# Patient Record
Sex: Male | Born: 1960 | Race: Black or African American | Hispanic: No | Marital: Married | Smoking: Never smoker
Health system: Southern US, Community
[De-identification: ages and names within clinical notes are randomized; demographics above are authoritative.]

## PROBLEM LIST (undated history)

## (undated) DIAGNOSIS — N200 Calculus of kidney: Secondary | ICD-10-CM

## (undated) DIAGNOSIS — I10 Essential (primary) hypertension: Secondary | ICD-10-CM

## (undated) HISTORY — PX: TONSILLECTOMY: SUR1361

---

## 2012-10-13 ENCOUNTER — Inpatient Hospital Stay (HOSPITAL_BASED_OUTPATIENT_CLINIC_OR_DEPARTMENT_OTHER)
Admission: EM | Admit: 2012-10-13 | Discharge: 2012-10-15 | DRG: 392 | Disposition: A | Discharge status: Home / Self Care | Payer: MEDICAID | Attending: Internal Medicine | Admitting: Internal Medicine

## 2012-10-13 ENCOUNTER — Emergency Department (HOSPITAL_BASED_OUTPATIENT_CLINIC_OR_DEPARTMENT_OTHER): Payer: Self-pay

## 2012-10-13 ENCOUNTER — Encounter (HOSPITAL_BASED_OUTPATIENT_CLINIC_OR_DEPARTMENT_OTHER): Payer: Self-pay | Admitting: *Deleted

## 2012-10-13 DIAGNOSIS — K644 Residual hemorrhoidal skin tags: Secondary | ICD-10-CM | POA: Diagnosis present

## 2012-10-13 DIAGNOSIS — Z87442 Personal history of urinary calculi: Secondary | ICD-10-CM

## 2012-10-13 DIAGNOSIS — K648 Other hemorrhoids: Secondary | ICD-10-CM | POA: Diagnosis present

## 2012-10-13 DIAGNOSIS — I10 Essential (primary) hypertension: Secondary | ICD-10-CM | POA: Diagnosis present

## 2012-10-13 DIAGNOSIS — A09 Infectious gastroenteritis and colitis, unspecified: Principal | ICD-10-CM | POA: Diagnosis present

## 2012-10-13 DIAGNOSIS — K529 Noninfective gastroenteritis and colitis, unspecified: Secondary | ICD-10-CM

## 2012-10-13 HISTORY — DX: Calculus of kidney: N20.0

## 2012-10-13 HISTORY — DX: Essential (primary) hypertension: I10

## 2012-10-13 LAB — CBC WITH DIFFERENTIAL/PLATELET
Basophils Absolute: 0.1 10*3/uL (ref 0.0–0.1)
Basophils Relative: 1 % (ref 0–1)
Eosinophils Absolute: 0.2 10*3/uL (ref 0.0–0.7)
HCT: 41.8 % (ref 39.0–52.0)
Hemoglobin: 14 g/dL (ref 13.0–17.0)
MCH: 32.6 pg (ref 26.0–34.0)
MCHC: 33.5 g/dL (ref 30.0–36.0)
Monocytes Absolute: 0.8 10*3/uL (ref 0.1–1.0)
Monocytes Relative: 8 % (ref 3–12)
RDW: 12.5 % (ref 11.5–15.5)

## 2012-10-13 LAB — COMPREHENSIVE METABOLIC PANEL
Albumin: 4.6 g/dL (ref 3.5–5.2)
BUN: 16 mg/dL (ref 6–23)
Calcium: 10.1 mg/dL (ref 8.4–10.5)
Creatinine, Ser: 1.3 mg/dL (ref 0.50–1.35)
Total Bilirubin: 0.4 mg/dL (ref 0.3–1.2)
Total Protein: 9 g/dL — ABNORMAL HIGH (ref 6.0–8.3)

## 2012-10-13 LAB — URINALYSIS, ROUTINE W REFLEX MICROSCOPIC
Bilirubin Urine: NEGATIVE
Ketones, ur: NEGATIVE mg/dL
Leukocytes, UA: NEGATIVE
Nitrite: NEGATIVE
Specific Gravity, Urine: 1.022 (ref 1.005–1.030)
Urobilinogen, UA: 0.2 mg/dL (ref 0.0–1.0)
pH: 5.5 (ref 5.0–8.0)

## 2012-10-13 LAB — URINE MICROSCOPIC-ADD ON

## 2012-10-13 LAB — LIPASE, BLOOD: Lipase: 32 U/L (ref 11–59)

## 2012-10-13 MED ORDER — SODIUM CHLORIDE 0.9 % IV SOLN
Freq: Once | INTRAVENOUS | Status: AC
  Administered 2012-10-13: via INTRAVENOUS

## 2012-10-13 MED ORDER — FENTANYL CITRATE 0.05 MG/ML IJ SOLN
100.0000 ug | Freq: Once | INTRAMUSCULAR | Status: DC
  Filled 2012-10-13: qty 2

## 2012-10-13 NOTE — ED Notes (Signed)
Left flank pain for over a week. Diarrhea off and on all week. He has had blood in his stools for the past 4 days.

## 2012-10-13 NOTE — ED Notes (Signed)
Pt reports left flank pain and diarrhea x 4 days. States that he has taken advil for the pain but nothing for the diarrhea. Denies nausea and vomiting with the flank pain. Denies burning on urination or blood in urine or stool. Pt rates flank pain 3/10 and refused pain medicine. Pt states diarrhea episodes have lessened in frequency, though pt had a diarrhea episode upon arrival to his room in the ER.

## 2012-10-13 NOTE — ED Provider Notes (Signed)
History   This chart was scribed for Hanley Seamen, MD by Albertha Ghee Rifaie. This patient was seen in room MH07/MH07 and the patient's care was started at 11:05 PM.   CSN: [id number]  Arrival date & time 10/13/12  2034   None     Chief Complaint  Patient presents with  . Diarrhea     The history is provided by the patient. No language interpreter was used.    Daniel Collier is a 51 y.o. male who presents to the Emergency Department complaining of a week of onset, intermittent diarrhea and 4 days of hematochezia that cleared up yesterday. There are no specific exacerbating or mitigating factors. The symptoms are moderate. He also c/o left flank pain a week ago, associated with generalized muscle cramping, that was not the same as kidney stone pain that he has had before. Pt denies nausea, vomiting, fever, chills. Pt denies smoking and alcohol use.    Past Medical History  Diagnosis Date  . Kidney stone     Past Surgical History  Procedure Date  . Tonsillectomy     No family history on file.  History  Substance Use Topics  . Smoking status: Never Smoker   . Smokeless tobacco: Not on file  . Alcohol Use: No      Review of Systems  All other systems reviewed and are negative.    Allergies  Review of patient's allergies indicates no known allergies.  Home Medications  No current outpatient prescriptions on file.  BP 177/104  Pulse 89  Temp 98.2 F (36.8 C) (Oral)  Resp 20  SpO2 97%  Physical Exam  Constitutional: He is oriented to person, place, and time. He appears well-developed and well-nourished.  HENT:  Head: Normocephalic and atraumatic.  Eyes: EOM are normal. Pupils are equal, round, and reactive to light.  Neck: Normal range of motion. Neck supple.  Cardiovascular: Normal rate, regular rhythm and normal heart sounds.   Pulmonary/Chest: Effort normal and breath sounds normal.  Abdominal: Soft. Bowel sounds are normal. He exhibits no distension.    Musculoskeletal: Normal range of motion. He exhibits no edema and no tenderness.  Neurological: He is alert and oriented to person, place, and time.    ED Course  Procedures (including critical care time)   DIAGNOSTIC STUDIES: Oxygen Saturation is 97% on room air , adequate by my interpretation.    COORDINATION OF CARE: 11:13 PM iscussed treatment plan with pt at bedside and pt agreed to plan.      MDM   Nursing notes and vitals signs, including pulse oximetry, reviewed.  Summary of this visit's results, reviewed by myself:  Labs:  Results for orders placed during the hospital encounter of 10/13/12  URINALYSIS, ROUTINE W REFLEX MICROSCOPIC      Component Value Range   Color, Urine YELLOW  YELLOW   APPearance CLEAR  CLEAR   Specific Gravity, Urine 1.022  1.005 - 1.030   pH 5.5  5.0 - 8.0   Glucose, UA NEGATIVE  NEGATIVE mg/dL   Hgb urine dipstick MODERATE (*) NEGATIVE   Bilirubin Urine NEGATIVE  NEGATIVE   Ketones, ur NEGATIVE  NEGATIVE mg/dL   Protein, ur NEGATIVE  NEGATIVE mg/dL   Urobilinogen, UA 0.2  0.0 - 1.0 mg/dL   Nitrite NEGATIVE  NEGATIVE   Leukocytes, UA NEGATIVE  NEGATIVE  URINE MICROSCOPIC-ADD ON      Component Value Range   Squamous Epithelial / LPF RARE  RARE   RBC /  HPF 7-10  <3 RBC/hpf   Bacteria, UA RARE  RARE  CBC WITH DIFFERENTIAL      Component Value Range   WBC 10.1  4.0 - 10.5 K/uL   RBC 4.29  4.22 - 5.81 MIL/uL   Hemoglobin 14.0  13.0 - 17.0 g/dL   HCT 16.1  09.6 - 04.5 %   MCV 97.4  78.0 - 100.0 fL   MCH 32.6  26.0 - 34.0 pg   MCHC 33.5  30.0 - 36.0 g/dL   RDW 40.9  81.1 - 91.4 %   Platelets 280  150 - 400 K/uL   Neutrophils Relative 58  43 - 77 %   Neutro Abs 5.9  1.7 - 7.7 K/uL   Lymphocytes Relative 31  12 - 46 %   Lymphs Abs 3.2  0.7 - 4.0 K/uL   Monocytes Relative 8  3 - 12 %   Monocytes Absolute 0.8  0.1 - 1.0 K/uL   Eosinophils Relative 2  0 - 5 %   Eosinophils Absolute 0.2  0.0 - 0.7 K/uL   Basophils Relative 1  0 - 1 %    Basophils Absolute 0.1  0.0 - 0.1 K/uL  COMPREHENSIVE METABOLIC PANEL      Component Value Range   Sodium 139  135 - 145 mEq/L   Potassium 4.3  3.5 - 5.1 mEq/L   Chloride 100  96 - 112 mEq/L   CO2 24  19 - 32 mEq/L   Glucose, Bld 103 (*) 70 - 99 mg/dL   BUN 16  6 - 23 mg/dL   Creatinine, Ser 7.82  0.50 - 1.35 mg/dL   Calcium 95.6  8.4 - 21.3 mg/dL   Total Protein 9.0 (*) 6.0 - 8.3 g/dL   Albumin 4.6  3.5 - 5.2 g/dL   AST 25  0 - 37 U/L   ALT 46  0 - 53 U/L   Alkaline Phosphatase 70  39 - 117 U/L   Total Bilirubin 0.4  0.3 - 1.2 mg/dL   GFR calc non Af Amer 63 (*) >90 mL/min   GFR calc Af Amer 73 (*) >90 mL/min  LIPASE, BLOOD      Component Value Range   Lipase 32  11 - 59 U/L    Imaging Studies: Dg Abd Acute W/chest  10/13/2012  *RADIOLOGY REPORT*  Clinical Data: Left flank pain.  ACUTE ABDOMEN SERIES (ABDOMEN 2 VIEW & CHEST 1 VIEW)  Comparison: None.  Findings: Single view chest demonstrates clear lungs and normal heart size.  No pneumothorax or pleural fluid.  Two views of the abdomen show no free intraperitoneal air. Multiple gas-filled and mildly dilated loops of small bowel seen in the left side of the abdomen.  There may be a small amount of gas in the transverse colon.  IMPRESSION: Findings worrisome for small bowel obstruction.   Original Report Authenticated By: Bernadene Bell. D'ALESSIO, M.D.    1:30 AM Findings described to patient. We will have him admitted to the hospitalist service. He has no history of colitis       I personally performed the services described in this documentation, which was scribed in my presence.  The recorded information has been reviewed and considered.      Hanley Seamen, MD 10/14/12 [phone]

## 2012-10-14 ENCOUNTER — Encounter (HOSPITAL_BASED_OUTPATIENT_CLINIC_OR_DEPARTMENT_OTHER): Payer: Self-pay | Admitting: *Deleted

## 2012-10-14 DIAGNOSIS — K5289 Other specified noninfective gastroenteritis and colitis: Secondary | ICD-10-CM

## 2012-10-14 DIAGNOSIS — R030 Elevated blood-pressure reading, without diagnosis of hypertension: Secondary | ICD-10-CM

## 2012-10-14 DIAGNOSIS — K529 Noninfective gastroenteritis and colitis, unspecified: Secondary | ICD-10-CM

## 2012-10-14 LAB — COMPREHENSIVE METABOLIC PANEL
ALT: 34 U/L (ref 0–53)
AST: 18 U/L (ref 0–37)
CO2: 23 mEq/L (ref 19–32)
Calcium: 9 mg/dL (ref 8.4–10.5)
GFR calc non Af Amer: 78 mL/min — ABNORMAL LOW (ref >90)
Sodium: 139 mEq/L (ref 135–145)
Total Protein: 7.1 g/dL (ref 6.0–8.3)

## 2012-10-14 LAB — CLOSTRIDIUM DIFFICILE BY PCR: Toxigenic C. Difficile by PCR: NEGATIVE

## 2012-10-14 MED ORDER — ONDANSETRON HCL 4 MG/2ML IJ SOLN: 4.0000 mg | Freq: Three times a day (TID) | INTRAMUSCULAR | Status: DC | PRN

## 2012-10-14 MED ORDER — IBUPROFEN 600 MG PO TABS
600.0000 mg | ORAL_TABLET | Freq: Four times a day (QID) | ORAL | Status: DC | PRN
  Administered 2012-10-14: 600 mg via ORAL
  Filled 2012-10-14 (×2): qty 1

## 2012-10-14 MED ORDER — ONDANSETRON HCL 4 MG/2ML IJ SOLN: 4.0000 mg | Freq: Four times a day (QID) | INTRAMUSCULAR | Status: DC | PRN

## 2012-10-14 MED ORDER — IOHEXOL 300 MG/ML  SOLN
50.0000 mL | Freq: Once | INTRAMUSCULAR | Status: AC | PRN
  Administered 2012-10-14: 50 mL via ORAL

## 2012-10-14 MED ORDER — IOHEXOL 300 MG/ML  SOLN
100.0000 mL | Freq: Once | INTRAMUSCULAR | Status: AC | PRN
  Administered 2012-10-14: 100 mL via INTRAVENOUS

## 2012-10-14 MED ORDER — ENALAPRILAT 1.25 MG/ML IV SOLN
1.2500 mg | Freq: Once | INTRAVENOUS | Status: AC
  Administered 2012-10-14: 1.25 mg via INTRAVENOUS
  Filled 2012-10-14: qty 2

## 2012-10-14 MED ORDER — SODIUM CHLORIDE 0.9 % IV SOLN: INTRAVENOUS | Status: DC

## 2012-10-14 MED ORDER — ACETAMINOPHEN 325 MG PO TABS: 650.0000 mg | ORAL_TABLET | Freq: Four times a day (QID) | ORAL | Status: DC | PRN

## 2012-10-14 MED ORDER — PEG 3350-KCL-NA BICARB-NACL 420 G PO SOLR
4000.0000 mL | Freq: Once | ORAL | Status: AC
  Administered 2012-10-14: 4000 mL via ORAL
  Filled 2012-10-14: qty 4000

## 2012-10-14 MED ORDER — METRONIDAZOLE IN NACL 5-0.79 MG/ML-% IV SOLN
500.0000 mg | Freq: Three times a day (TID) | INTRAVENOUS | Status: DC
  Administered 2012-10-14 – 2012-10-15 (×5): 500 mg via INTRAVENOUS
  Filled 2012-10-14 (×7): qty 100

## 2012-10-14 MED ORDER — ACETAMINOPHEN 650 MG RE SUPP: 650.0000 mg | Freq: Four times a day (QID) | RECTAL | Status: DC | PRN

## 2012-10-14 MED ORDER — SODIUM CHLORIDE 0.9 % IV SOLN
INTRAVENOUS | Status: AC
  Administered 2012-10-14: 03:00:00 via INTRAVENOUS

## 2012-10-14 MED ORDER — CIPROFLOXACIN IN D5W 400 MG/200ML IV SOLN
400.0000 mg | Freq: Two times a day (BID) | INTRAVENOUS | Status: DC
  Administered 2012-10-14 – 2012-10-15 (×3): 400 mg via INTRAVENOUS
  Filled 2012-10-14 (×6): qty 200

## 2012-10-14 MED ORDER — ONDANSETRON HCL 4 MG PO TABS: 4.0000 mg | ORAL_TABLET | Freq: Four times a day (QID) | ORAL | Status: DC | PRN

## 2012-10-14 MED ORDER — ENALAPRIL MALEATE 5 MG PO TABS
5.0000 mg | ORAL_TABLET | Freq: Every day | ORAL | Status: DC
  Administered 2012-10-14 – 2012-10-15 (×2): 5 mg via ORAL
  Filled 2012-10-14 (×3): qty 1

## 2012-10-14 MED ORDER — HYDRALAZINE HCL 20 MG/ML IJ SOLN
10.0000 mg | INTRAMUSCULAR | Status: DC | PRN
  Filled 2012-10-14: qty 0.5

## 2012-10-14 NOTE — Care Management Note (Signed)
    Page 1 of 1   10/16/2012     8:45:35 AM   CARE MANAGEMENT NOTE 10/16/2012  Patient:  Daniel Collier, Daniel Collier   Account Number:  [id number]  Date Initiated:  10/14/2012  Documentation initiated by:  Letha Cape  Subjective/Objective Assessment:   dx colitis, cdiff susp  admit- lives with son. pta independent.     Action/Plan:   Anticipated DC Date:  10/15/2012   Anticipated DC Plan:  HOME/SELF CARE      DC Planning Services  CM consult      Choice offered to / List presented to:             Status of service:  Completed, signed off Medicare Important Message given?   (If response is "NO", the following Medicare IM given date fields will be blank) Date Medicare IM given:   Date Additional Medicare IM given:    Discharge Disposition:  HOME/SELF CARE  Per UR Regulation:  Reviewed for med. necessity/level of care/duration of stay  If discussed at Long Length of Stay Meetings, dates discussed:    Comments:  10/14/12 16:34 Letha Cape RN, BSN [phone] patient lives with son, pta independent.  Patient has follow up appt with Jovita Kussmaul, patient is eligible for med ast if needed.  Patient has transportation at dc.  GI MD will see patient while he is here in the hospital. Patient for possible dc tomorrow.

## 2012-10-14 NOTE — ED Notes (Signed)
Attempted to call report to [phone]. Nurse unable to take report. Will call back to [facility] when able to receive report

## 2012-10-14 NOTE — ED Notes (Signed)
Spoke with patient about being admitted to the hospital. Pt finally agreed to be admitted. Informed EDP that pt has agreed to be admitted to the hospital.

## 2012-10-14 NOTE — Progress Notes (Signed)
Patient ID: Daniel Collier, male   DOB: Feb 10, 1961, 51 y.o.   MRN: [id number] [facility] PROGRESS NOTE  Daniel Collier UXL:[id number] DOB: [birth year] DOA: 10/13/2012 PCP: No primary provider on file.  Assessment/Plan:   Colitis.  Patient with 1 week diarrhea, BRBPR Thursday thru Sunday (no bleeding since Sunday), some abdominal cramping just before a bowel movement.Just 3 diarrhea BM overnight and now stool getting more formed.No abdominal pain. Tolerating regular diet  Guiac +  Hgb 14.0 (no previous blood work to compare it to)  Sister has a history of diverticulitis.  No other family history.  No personal history of previous symptoms.  Dr. Elnoria Howard will see in consultation today (Thank you!)  Suspect infectious colitis-continue with Cipro/Flagyl, await GI work up, he is 51 years and will need a colonoscopy at some point in the near future-will defer to GI to decide on the timing  HTN  Will start low dose enalapril  Social  Will need PCP   Code Status: Full Family Communication: Multiple family members at bedside. Disposition Plan:  Home when medically appropriate.   Consultants:  Gastroenterology, Dr. Elnoria Howard  Procedures:  none  Antibiotics:  Cipro, Flagyl  HPI/Subjective: Bleeding stopped Sunday.  3 diarrhea BM over night.  Objective: Filed Vitals:   10/14/12 0250 10/14/12 0300 10/14/12 0322 10/14/12 0411  BP:  140/87 140/87 140/94  Pulse:  88 91 83  Temp:      TempSrc:      Resp:   16 16  Height:    6\' 1"  (1.854 m)  Weight:    110.7 kg (244 lb 0.8 oz)  SpO2: 100% 95% 96% 97%   No intake or output data in the 24 hours ending 10/14/12 0936 Filed Weights   10/14/12 0411  Weight: 110.7 kg (244 lb 0.8 oz)    Exam:   General:  A&O, Non Toxic  Cardiovascular: RRR, no M/R/G  Respiratory: CTA, no W/C/R  Abdomen: Soft +BS, Nt, Mildly distended  Extremities:  No lower extremity edema  Data Reviewed: Basic Metabolic Panel:  Lab 10/14/12 5366  10/13/12 2232  NA 139 139  K 3.7 4.3  CL 105 100  CO2 23 24  GLUCOSE 105* 103*  BUN 12 16  CREATININE 1.08 1.30  CALCIUM 9.0 10.1  MG -- --  PHOS -- --   Liver Function Tests:  Lab 10/14/12 0600 10/13/12 2232  AST 18 25  ALT 34 46  ALKPHOS 59 70  BILITOT 0.6 0.4  PROT 7.1 9.0*  ALBUMIN 3.6 4.6    Lab 10/13/12 2232  LIPASE 32  AMYLASE --   CBC:  Lab 10/13/12 2232  WBC 10.1  NEUTROABS 5.9  HGB 14.0  HCT 41.8  MCV 97.4  PLT 280     Studies: Ct Abdomen Pelvis W Contrast  10/14/2012  *RADIOLOGY REPORT*  Clinical Data: Bloody diarrhea.  CT ABDOMEN AND PELVIS WITH CONTRAST  Technique:  Multidetector CT imaging of the abdomen and pelvis was performed following the standard protocol during bolus administration of intravenous contrast.  Contrast: 50mL OMNIPAQUE IOHEXOL 300 MG/ML  SOLN, OMNIPAQUE IOHEXOL 300 MG/ML  SOLN  Comparison: Acute abdominal series 10/13/2012.  Findings: The lung bases are clear without focal nodule, mass, or airspace disease.  The heart size is normal.  No significant pleural or pericardial effusion is present.  Mild fatty infiltration of the liver is evident.  To 6 mm hypodense lesions are present within the left lobe the liver, likely representing small cysts.  The  spleen is unremarkable.  The stomach, duodenum, pancreas are within normal limits.  Common bile duct and gallbladder are normal.  There is slight fullness of the adrenal glands without a focal lesion.  A simple cyst in the upper portion of the right kidney measures 13 mm.  At least four separate nonobstructing stones are present in the right kidney.  The largest is 4 mm. Two separate 4.5 mm nonobstructing stones are present in the left kidney.  The ureters are of normal size.  Urinary bladder is collapsed.  The prostate gland is enlarged, measuring 5.8 cm.  Wall thickening is present throughout the rectosigmoid colon. There is some wall thickening in the descending and distal transverse colon  as well.  There is collapse of the cecum and ascending colon.  Diverticular changes are noted in the descending and sigmoid colon.  The more proximal colon is unremarkable.  The appendix is visualized and within normal limits.  No free air or free fluid is evident.  There is no significant adenopathy.  The bone windows are unremarkable.  IMPRESSION:  1.  Extensive wall thickening throughout the distal colon suggesting a nonspecific colitis. 2.  Mild diverticular changes are present.  This does not appear to be the etiology of the colitis. 3.  Prostatic enlargement. 4.  Bilateral nonobstructing nephrolithiasis. 5.  Probable fatty infiltration of the liver.   Original Report Authenticated By: Jamesetta Orleans. MATTERN, M.D.    Dg Abd Acute W/chest  10/13/2012  *RADIOLOGY REPORT*  Clinical Data: Left flank pain.  ACUTE ABDOMEN SERIES (ABDOMEN 2 VIEW & CHEST 1 VIEW)  Comparison: None.  Findings: Single view chest demonstrates clear lungs and normal heart size.  No pneumothorax or pleural fluid.  Two views of the abdomen show no free intraperitoneal air. Multiple gas-filled and mildly dilated loops of small bowel seen in the left side of the abdomen.  There may be a small amount of gas in the transverse colon.  IMPRESSION: Findings worrisome for small bowel obstruction.   Original Report Authenticated By: Bernadene Bell. D'ALESSIO, M.D.     Scheduled Meds:   . sodium chloride   Intravenous Once  . sodium chloride   Intravenous STAT  . ciprofloxacin  400 mg Intravenous Q12H  . enalaprilat  1.25 mg Intravenous Once  . metronidazole  500 mg Intravenous Q8H  . DISCONTD: fentaNYL  100 mcg Intravenous Once   Continuous Infusions:   . sodium chloride 100 mL/hr (10/14/12 0610)    Principal Problem:  *Colitis Active Problems:  Elevated blood pressure    Time spent: 30 min.    Macon, Sandiford  [facility] Pager [phone]. If 8PM-8AM, please contact night-coverage at [email or web address], password  [facility] 10/14/2012, 9:36 AM  LOS: 1 day     Attending  Seen and examined, agree with the assessment and plan as outlined above. He is doing much better, diarrhea has decreased and stools are getting much more formed, patient belly is soft and he is tolerating a regular diet. GI will see him today, if no w/u being planned inpatient (i.e colonoscopy or sigmoidoscopy)-he can be discharged on oral antibiotics. Await GI recommendations prior to planning for discharge  Windell Norfolk MD

## 2012-10-14 NOTE — H&P (Signed)
Daniel Collier is an 51 y.o. male.   Patient was examined on October 14, 2012. PCP - none. Chief Complaint: Diarrhea. HPI: 51 year old male with no significant past medical history has been experiencing diarrhea for last one week which has been gradually worsening. Patient started noticing diarrhea week ago and subsequently had bloody stools. Denies any nausea vomiting abdominal pain fever chills. Denies having used any antibiotics recently or any recent travel. Since patient's symptoms were persistent patient [facility] the [facility] CT abdomen and pelvis shows diffuse colitis and patient has been admitted for further management. Patient patient will pressure was found to be elevated.  Past Medical History  Diagnosis Date  . Kidney stone   . Hypertension     Past Surgical History  Procedure Date  . Tonsillectomy     Family History  Problem Relation Age of Onset  . Other Neg Hx    Social History:  reports that he has never smoked. He does not have any smokeless tobacco history on file. He reports that he does not drink alcohol or use illicit drugs.  Allergies: No Known Allergies  No prescriptions prior to admission    Results for orders placed during the hospital encounter of 10/13/12 (from the past 48 hour(s))  URINALYSIS, ROUTINE W REFLEX MICROSCOPIC     Status: Abnormal   Collection Time   10/13/12  8:41 PM      Component Value Range Comment   Color, Urine YELLOW  YELLOW    APPearance CLEAR  CLEAR    Specific Gravity, Urine 1.022  1.005 - 1.030    pH 5.5  5.0 - 8.0    Glucose, UA NEGATIVE  NEGATIVE mg/dL    Hgb urine dipstick MODERATE (*) NEGATIVE    Bilirubin Urine NEGATIVE  NEGATIVE    Ketones, ur NEGATIVE  NEGATIVE mg/dL    Protein, ur NEGATIVE  NEGATIVE mg/dL    Urobilinogen, UA 0.2  0.0 - 1.0 mg/dL    Nitrite NEGATIVE  NEGATIVE    Leukocytes, UA NEGATIVE  NEGATIVE   URINE MICROSCOPIC-ADD ON     Status: Normal   Collection Time   10/13/12  8:41 PM       Component Value Range Comment   Squamous Epithelial / LPF RARE  RARE    RBC / HPF 7-10  <3 RBC/hpf    Bacteria, UA RARE  RARE   CBC WITH DIFFERENTIAL     Status: Normal   Collection Time   10/13/12 10:32 PM      Component Value Range Comment   WBC 10.1  4.0 - 10.5 K/uL    RBC 4.29  4.22 - 5.81 MIL/uL    Hemoglobin 14.0  13.0 - 17.0 g/dL    HCT 16.1  09.6 - 04.5 %    MCV 97.4  78.0 - 100.0 fL    MCH 32.6  26.0 - 34.0 pg    MCHC 33.5  30.0 - 36.0 g/dL    RDW 40.9  81.1 - 91.4 %    Platelets 280  150 - 400 K/uL    Neutrophils Relative 58  43 - 77 %    Neutro Abs 5.9  1.7 - 7.7 K/uL    Lymphocytes Relative 31  12 - 46 %    Lymphs Abs 3.2  0.7 - 4.0 K/uL    Monocytes Relative 8  3 - 12 %    Monocytes Absolute 0.8  0.1 - 1.0 K/uL  Eosinophils Relative 2  0 - 5 %    Eosinophils Absolute 0.2  0.0 - 0.7 K/uL    Basophils Relative 1  0 - 1 %    Basophils Absolute 0.1  0.0 - 0.1 K/uL   COMPREHENSIVE METABOLIC PANEL     Status: Abnormal   Collection Time   10/13/12 10:32 PM      Component Value Range Comment   Sodium 139  135 - 145 mEq/L    Potassium 4.3  3.5 - 5.1 mEq/L    Chloride 100  96 - 112 mEq/L    CO2 24  19 - 32 mEq/L    Glucose, Bld 103 (*) 70 - 99 mg/dL    BUN 16  6 - 23 mg/dL    Creatinine, Ser 1.61  0.50 - 1.35 mg/dL    Calcium 09.6  8.4 - 10.5 mg/dL    Total Protein 9.0 (*) 6.0 - 8.3 g/dL    Albumin 4.6  3.5 - 5.2 g/dL    AST 25  0 - 37 U/L    ALT 46  0 - 53 U/L    Alkaline Phosphatase 70  39 - 117 U/L    Total Bilirubin 0.4  0.3 - 1.2 mg/dL    GFR calc non Af Amer 63 (*) >90 mL/min    GFR calc Af Amer 73 (*) >90 mL/min   LIPASE, BLOOD     Status: Normal   Collection Time   10/13/12 10:32 PM      Component Value Range Comment   Lipase 32  11 - 59 U/L   OCCULT BLOOD X 1 CARD TO LAB, STOOL     Status: Normal   Collection Time   10/13/12 11:56 PM      Component Value Range Comment   Fecal Occult Bld POSITIVE      Ct Abdomen Pelvis W  Contrast  10/14/2012  *RADIOLOGY REPORT*  Clinical Data: Bloody diarrhea.  CT ABDOMEN AND PELVIS WITH CONTRAST  Technique:  Multidetector CT imaging of the abdomen and pelvis was performed following the standard protocol during bolus administration of intravenous contrast.  Contrast: 50mL OMNIPAQUE IOHEXOL 300 MG/ML  SOLN, OMNIPAQUE IOHEXOL 300 MG/ML  SOLN  Comparison: Acute abdominal series 10/13/2012.  Findings: The lung bases are clear without focal nodule, mass, or airspace disease.  The heart size is normal.  No significant pleural or pericardial effusion is present.  Mild fatty infiltration of the liver is evident.  To 6 mm hypodense lesions are present within the left lobe the liver, likely representing small cysts.  The spleen is unremarkable.  The stomach, duodenum, pancreas are within normal limits.  Common bile duct and gallbladder are normal.  There is slight fullness of the adrenal glands without a focal lesion.  A simple cyst in the upper portion of the right kidney measures 13 mm.  At least four separate nonobstructing stones are present in the right kidney.  The largest is 4 mm. Two separate 4.5 mm nonobstructing stones are present in the left kidney.  The ureters are of normal size.  Urinary bladder is collapsed.  The prostate gland is enlarged, measuring 5.8 cm.  Wall thickening is present throughout the rectosigmoid colon. There is some wall thickening in the descending and distal transverse colon as well.  There is collapse of the cecum and ascending colon.  Diverticular changes are noted in the descending and sigmoid colon.  The more proximal colon is unremarkable.  The appendix is  visualized and within normal limits.  No free air or free fluid is evident.  There is no significant adenopathy.  The bone windows are unremarkable.  IMPRESSION:  1.  Extensive wall thickening throughout the distal colon suggesting a nonspecific colitis. 2.  Mild diverticular changes are present.  This does  not appear to be the etiology of the colitis. 3.  Prostatic enlargement. 4.  Bilateral nonobstructing nephrolithiasis. 5.  Probable fatty infiltration of the liver.   Original Report Authenticated By: Jamesetta Orleans. MATTERN, M.D.    Dg Abd Acute W/chest  10/13/2012  *RADIOLOGY REPORT*  Clinical Data: Left flank pain.  ACUTE ABDOMEN SERIES (ABDOMEN 2 VIEW & CHEST 1 VIEW)  Comparison: None.  Findings: Single view chest demonstrates clear lungs and normal heart size.  No pneumothorax or pleural fluid.  Two views of the abdomen show no free intraperitoneal air. Multiple gas-filled and mildly dilated loops of small bowel seen in the left side of the abdomen.  There may be a small amount of gas in the transverse colon.  IMPRESSION: Findings worrisome for small bowel obstruction.   Original Report Authenticated By: Bernadene Bell. Maricela Curet, M.D.     Review of Systems  Constitutional: Negative.   HENT: Negative.   Eyes: Negative.   Respiratory: Negative.   Cardiovascular: Negative.   Gastrointestinal: Positive for diarrhea.  Genitourinary: Negative.   Musculoskeletal: Negative.   Skin: Negative.   Neurological: Negative.   Endo/Heme/Allergies: Negative.   Psychiatric/Behavioral: Negative.     Blood pressure 140/94, pulse 83, temperature 98.2 F (36.8 C), temperature source Oral, resp. rate 16, height 6\' 1"  (1.854 m), weight 110.7 kg (244 lb 0.8 oz), SpO2 97.00%. Physical Exam  Constitutional: He is oriented to person, place, and time. He appears well-developed and well-nourished. No distress.  HENT:  Head: Normocephalic and atraumatic.  Right Ear: External ear normal.  Left Ear: External ear normal.  Mouth/Throat: Oropharynx is clear and moist. No oropharyngeal exudate.  Eyes: Conjunctivae normal are normal. Pupils are equal, round, and reactive to light. Right eye exhibits no discharge. Left eye exhibits no discharge. No scleral icterus.  Neck: Normal range of motion. Neck supple.   Cardiovascular: Normal rate and regular rhythm.   Respiratory: Effort normal and breath sounds normal. No respiratory distress. He has no wheezes. He has no rales.  GI: Soft. Bowel sounds are normal. He exhibits no distension. There is no tenderness. There is no rebound and no guarding.  Musculoskeletal: He exhibits no edema and no tenderness.  Neurological: He is alert and oriented to person, place, and time.       Moves all extremities.  Skin: Skin is warm and dry. He is not diaphoretic.     Assessment/Plan #1. Colitis - check for stool culture and C. difficile PCR. Check lactic acid level. At this time patient will placed empirically on Cipro and Flagyl. Gently hydrate. #2. Elevated blood pressure - patient has been placed on when necessary IV hydralazine for systolic blood pressure more 160. His blood pressure tends to be high then patient will need definite antihypertensives. #3. History of nephrolithiasis.  CODE STATUS - full code.  Darthula Desa N. 10/14/2012, 5:46 AM

## 2012-10-14 NOTE — Consult Note (Signed)
Reason for Consult: Colitis and bloody diarrhea Referring Physician: [facility]  Ranger Petrich HPI: This is a 51 year old male admitted for bloody diarrhea.  His symptoms started one week ago with an acute onset of watery diarrhea that progressed to hematochezia this past Friday.  The bleeding persisted until this past Sunday.  He denies any infectious contacts or recent antibiotic use.  No prior history of hematochezia and he has never had a colonoscopy.  Since his hospitalization his diarrhea has improved from 10+ bowel movements per day to 3 BMs.  He is negative for C. Diff.  There is no family history of IBD.  Past Medical History  Diagnosis Date  . Kidney stone   . Hypertension     Past Surgical History  Procedure Date  . Tonsillectomy     Family History  Problem Relation Age of Onset  . Other Neg Hx     Social History:  reports that he has never smoked. He does not have any smokeless tobacco history on file. He reports that he does not drink alcohol or use illicit drugs.  Allergies: No Known Allergies  Medications:  Scheduled:   . sodium chloride   Intravenous Once  . sodium chloride   Intravenous STAT  . ciprofloxacin  400 mg Intravenous Q12H  . enalapril  5 mg Oral Daily  . enalaprilat  1.25 mg Intravenous Once  . metronidazole  500 mg Intravenous Q8H  . polyethylene glycol-electrolytes  4,000 mL Oral Once  . DISCONTD: fentaNYL  100 mcg Intravenous Once   Continuous:   . DISCONTD: sodium chloride 100 mL/hr (10/14/12 0610)    Results for orders placed during the hospital encounter of 10/13/12 (from the past 24 hour(s))  URINALYSIS, ROUTINE W REFLEX MICROSCOPIC     Status: Abnormal   Collection Time   10/13/12  8:41 PM      Component Value Range   Color, Urine YELLOW  YELLOW   APPearance CLEAR  CLEAR   Specific Gravity, Urine 1.022  1.005 - 1.030   pH 5.5  5.0 - 8.0   Glucose, UA NEGATIVE  NEGATIVE mg/dL   Hgb urine dipstick MODERATE (*) NEGATIVE   Bilirubin Urine NEGATIVE  NEGATIVE   Ketones, ur NEGATIVE  NEGATIVE mg/dL   Protein, ur NEGATIVE  NEGATIVE mg/dL   Urobilinogen, UA 0.2  0.0 - 1.0 mg/dL   Nitrite NEGATIVE  NEGATIVE   Leukocytes, UA NEGATIVE  NEGATIVE  URINE MICROSCOPIC-ADD ON     Status: Normal   Collection Time   10/13/12  8:41 PM      Component Value Range   Squamous Epithelial / LPF RARE  RARE   RBC / HPF 7-10  <3 RBC/hpf   Bacteria, UA RARE  RARE  CBC WITH DIFFERENTIAL     Status: Normal   Collection Time   10/13/12 10:32 PM      Component Value Range   WBC 10.1  4.0 - 10.5 K/uL   RBC 4.29  4.22 - 5.81 MIL/uL   Hemoglobin 14.0  13.0 - 17.0 g/dL   HCT 56.2  13.0 - 86.5 %   MCV 97.4  78.0 - 100.0 fL   MCH 32.6  26.0 - 34.0 pg   MCHC 33.5  30.0 - 36.0 g/dL   RDW 78.4  69.6 - 29.5 %   Platelets 280  150 - 400 K/uL   Neutrophils Relative 58  43 - 77 %   Neutro Abs 5.9  1.7 - 7.7  K/uL   Lymphocytes Relative 31  12 - 46 %   Lymphs Abs 3.2  0.7 - 4.0 K/uL   Monocytes Relative 8  3 - 12 %   Monocytes Absolute 0.8  0.1 - 1.0 K/uL   Eosinophils Relative 2  0 - 5 %   Eosinophils Absolute 0.2  0.0 - 0.7 K/uL   Basophils Relative 1  0 - 1 %   Basophils Absolute 0.1  0.0 - 0.1 K/uL  COMPREHENSIVE METABOLIC PANEL     Status: Abnormal   Collection Time   10/13/12 10:32 PM      Component Value Range   Sodium 139  135 - 145 mEq/L   Potassium 4.3  3.5 - 5.1 mEq/L   Chloride 100  96 - 112 mEq/L   CO2 24  19 - 32 mEq/L   Glucose, Bld 103 (*) 70 - 99 mg/dL   BUN 16  6 - 23 mg/dL   Creatinine, Ser 9.60  0.50 - 1.35 mg/dL   Calcium 45.4  8.4 - 09.8 mg/dL   Total Protein 9.0 (*) 6.0 - 8.3 g/dL   Albumin 4.6  3.5 - 5.2 g/dL   AST 25  0 - 37 U/L   ALT 46  0 - 53 U/L   Alkaline Phosphatase 70  39 - 117 U/L   Total Bilirubin 0.4  0.3 - 1.2 mg/dL   GFR calc non Af Amer 63 (*) >90 mL/min   GFR calc Af Amer 73 (*) >90 mL/min  LIPASE, BLOOD     Status: Normal   Collection Time   10/13/12 10:32 PM      Component Value  Range   Lipase 32  11 - 59 U/L  CLOSTRIDIUM DIFFICILE BY PCR     Status: Normal   Collection Time   10/13/12 11:56 PM      Component Value Range   C difficile by pcr NEGATIVE  NEGATIVE  OCCULT BLOOD X 1 CARD TO LAB, STOOL     Status: Normal   Collection Time   10/13/12 11:56 PM      Component Value Range   Fecal Occult Bld POSITIVE    STOOL CULTURE     Status: Normal (Preliminary result)   Collection Time   10/13/12 11:57 PM      Component Value Range   Specimen Description STOOL     Special Requests NONE     Culture Culture reincubated for better growth     Report Status PENDING    COMPREHENSIVE METABOLIC PANEL     Status: Abnormal   Collection Time   10/14/12  6:00 AM      Component Value Range   Sodium 139  135 - 145 mEq/L   Potassium 3.7  3.5 - 5.1 mEq/L   Chloride 105  96 - 112 mEq/L   CO2 23  19 - 32 mEq/L   Glucose, Bld 105 (*) 70 - 99 mg/dL   BUN 12  6 - 23 mg/dL   Creatinine, Ser 1.19  0.50 - 1.35 mg/dL   Calcium 9.0  8.4 - 14.7 mg/dL   Total Protein 7.1  6.0 - 8.3 g/dL   Albumin 3.6  3.5 - 5.2 g/dL   AST 18  0 - 37 U/L   ALT 34  0 - 53 U/L   Alkaline Phosphatase 59  39 - 117 U/L   Total Bilirubin 0.6  0.3 - 1.2 mg/dL   GFR calc non Af Amer 78 (*) >  90 mL/min   GFR calc Af Amer >90  >90 mL/min  LACTIC ACID, PLASMA     Status: Normal   Collection Time   10/14/12  6:00 AM      Component Value Range   Lactic Acid, Venous 0.9  0.5 - 2.2 mmol/L     Ct Abdomen Pelvis W Contrast  10/14/2012  *RADIOLOGY REPORT*  Clinical Data: Bloody diarrhea.  CT ABDOMEN AND PELVIS WITH CONTRAST  Technique:  Multidetector CT imaging of the abdomen and pelvis was performed following the standard protocol during bolus administration of intravenous contrast.  Contrast: 50mL OMNIPAQUE IOHEXOL 300 MG/ML  SOLN, OMNIPAQUE IOHEXOL 300 MG/ML  SOLN  Comparison: Acute abdominal series 10/13/2012.  Findings: The lung bases are clear without focal nodule, mass, or airspace disease.  The  heart size is normal.  No significant pleural or pericardial effusion is present.  Mild fatty infiltration of the liver is evident.  To 6 mm hypodense lesions are present within the left lobe the liver, likely representing small cysts.  The spleen is unremarkable.  The stomach, duodenum, pancreas are within normal limits.  Common bile duct and gallbladder are normal.  There is slight fullness of the adrenal glands without a focal lesion.  A simple cyst in the upper portion of the right kidney measures 13 mm.  At least four separate nonobstructing stones are present in the right kidney.  The largest is 4 mm. Two separate 4.5 mm nonobstructing stones are present in the left kidney.  The ureters are of normal size.  Urinary bladder is collapsed.  The prostate gland is enlarged, measuring 5.8 cm.  Wall thickening is present throughout the rectosigmoid colon. There is some wall thickening in the descending and distal transverse colon as well.  There is collapse of the cecum and ascending colon.  Diverticular changes are noted in the descending and sigmoid colon.  The more proximal colon is unremarkable.  The appendix is visualized and within normal limits.  No free air or free fluid is evident.  There is no significant adenopathy.  The bone windows are unremarkable.  IMPRESSION:  1.  Extensive wall thickening throughout the distal colon suggesting a nonspecific colitis. 2.  Mild diverticular changes are present.  This does not appear to be the etiology of the colitis. 3.  Prostatic enlargement. 4.  Bilateral nonobstructing nephrolithiasis. 5.  Probable fatty infiltration of the liver.   Original Report Authenticated By: Jamesetta Orleans. MATTERN, M.D.    Dg Abd Acute W/chest  10/13/2012  *RADIOLOGY REPORT*  Clinical Data: Left flank pain.  ACUTE ABDOMEN SERIES (ABDOMEN 2 VIEW & CHEST 1 VIEW)  Comparison: None.  Findings: Single view chest demonstrates clear lungs and normal heart size.  No pneumothorax or pleural fluid.   Two views of the abdomen show no free intraperitoneal air. Multiple gas-filled and mildly dilated loops of small bowel seen in the left side of the abdomen.  There may be a small amount of gas in the transverse colon.  IMPRESSION: Findings worrisome for small bowel obstruction.   Original Report Authenticated By: Bernadene Bell. D'ALESSIO, M.D.     ROS:  As stated above in the HPI otherwise negative.  Blood pressure 136/76, pulse 84, temperature 98.2 F (36.8 C), temperature source Oral, resp. rate 20, height 6\' 1"  (1.854 m), weight 110.7 kg (244 lb 0.8 oz), SpO2 94.00%.    PE: Gen: NAD, Alert and Oriented HEENT:  [place]/AT, EOMI Neck: Supple, no LAD Lungs: CTA Bilaterally CV:  RRR without M/G/R ABM: Soft, NTND, +BS Ext: No C/C/E  Assessment/Plan: 1) Colitis. 2) Bloody diarrhea.   The patient is stable at this time, but he is fatigued appearing.  It will be prudent to perform a colonoscopy for further evaluation of his colitis.  I cannot discern if it is infectious versus IBD.  Plan: 1) Colonoscopy tomorrow with Dr. Loreta Ave.  Yama Nielson D 10/14/2012, 4:57 PM

## 2012-10-14 NOTE — Plan of Care (Signed)
Problem: Phase I Progression Outcomes Goal: Initial discharge plan identified Outcome: Progressing Home with family per report

## 2012-10-15 ENCOUNTER — Encounter (HOSPITAL_COMMUNITY): Payer: Self-pay | Admitting: Gastroenterology

## 2012-10-15 ENCOUNTER — Encounter (HOSPITAL_COMMUNITY)
Admission: EM | Disposition: A | Discharge status: Home / Self Care | Payer: Self-pay | Source: Home / Self Care | Attending: Internal Medicine

## 2012-10-15 HISTORY — PX: COLONOSCOPY: SHX5424

## 2012-10-15 LAB — BASIC METABOLIC PANEL
CO2: 23 mEq/L (ref 19–32)
GFR calc non Af Amer: 78 mL/min — ABNORMAL LOW (ref >90)
Glucose, Bld: 96 mg/dL (ref 70–99)
Potassium: 3.5 mEq/L (ref 3.5–5.1)
Sodium: 134 mEq/L — ABNORMAL LOW (ref 135–145)

## 2012-10-15 LAB — CBC WITH DIFFERENTIAL/PLATELET
Lymphocytes Relative: 32 % (ref 12–46)
Lymphs Abs: 2 10*3/uL (ref 0.7–4.0)
Neutro Abs: 3.5 10*3/uL (ref 1.7–7.7)
Neutrophils Relative %: 57 % (ref 43–77)
Platelets: 244 10*3/uL (ref 150–400)
RBC: 3.64 MIL/uL — ABNORMAL LOW (ref 4.22–5.81)
WBC: 6.1 10*3/uL (ref 4.0–10.5)

## 2012-10-15 SURGERY — COLONOSCOPY
Anesthesia: Moderate Sedation

## 2012-10-15 MED ORDER — MIDAZOLAM HCL 5 MG/ML IJ SOLN
INTRAMUSCULAR | Status: AC
  Filled 2012-10-15: qty 4

## 2012-10-15 MED ORDER — MIDAZOLAM HCL 5 MG/5ML IJ SOLN
INTRAMUSCULAR | Status: DC | PRN
  Administered 2012-10-15 (×4): 2.5 mg via INTRAVENOUS

## 2012-10-15 MED ORDER — FENTANYL CITRATE 0.05 MG/ML IJ SOLN
INTRAMUSCULAR | Status: AC
  Filled 2012-10-15: qty 4

## 2012-10-15 MED ORDER — ENALAPRIL MALEATE 5 MG PO TABS: 5.0000 mg | ORAL_TABLET | Freq: Every day | ORAL | Status: DC

## 2012-10-15 MED ORDER — SODIUM CHLORIDE 0.9 % IV SOLN
INTRAVENOUS | Status: DC
  Administered 2012-10-15: 20 mL/h via INTRAVENOUS
  Administered 2012-10-15: 500 mL via INTRAVENOUS

## 2012-10-15 MED ORDER — METRONIDAZOLE 500 MG PO TABS: 500.0000 mg | ORAL_TABLET | Freq: Three times a day (TID) | ORAL | Status: DC

## 2012-10-15 MED ORDER — FENTANYL CITRATE 0.05 MG/ML IJ SOLN
INTRAMUSCULAR | Status: DC | PRN
  Administered 2012-10-15 (×4): 25 ug via INTRAVENOUS

## 2012-10-15 MED ORDER — CIPROFLOXACIN HCL 500 MG PO TABS: 500.0000 mg | ORAL_TABLET | Freq: Two times a day (BID) | ORAL | Status: DC

## 2012-10-15 NOTE — Discharge Summary (Signed)
Addendum  Patient seen and examined, chart and data base reviewed.  I agree with the above assessment and discharge plan.  For full details please see Mrs. Algis Downs PA. Note.  Colitis, treated empirically as infection was with Cipro and Flagyl for 10 days.  Pending biopsy results followup as outpatient.   Clint Lipps, MD [facility] Pager: [phone] 10/15/2012, 5:27 PM

## 2012-10-15 NOTE — Op Note (Signed)
[facility] [address] [place], [address]   OPERATIVE PROCEDURE REPORT  PATIENT: Daniel Collier, Daniel Collier  MR#: [id number] BIRTHDATE: 1961-09-05  GENDER: Male ENDOSCOPIST: Jeani Hawking, MD ASSISTANT:   Cathlean Marseilles, RN, CGRN Dorisann Frames, technician PROCEDURE DATE: 10/15/2012 PROCEDURE:   Colonoscopy with biopsy ASA CLASS:   Class III INDICATIONS:an abnormal imaging results. MEDICATIONS: Versed 10 mg IV and Fentanyl 100 mcg IV  DESCRIPTION OF PROCEDURE:   After the risks benefits and alternatives of the procedure were thoroughly explained, informed consent was obtained.  A digital rectal exam revealed no abnormalities of the rectum.    The Pentax Colonoscope [id number] endoscope was introduced through the anus  and advanced to the cecum, which was identified by both the appendix and ileocecal valve , No adverse events experienced.    The quality of the prep was good. .  The instrument was then slowly withdrawn as the colon was fully examined.     FINDINGS: Very minimal and patchy erythema was noted in the colon. Random cold biopsies were obtained.  No evidence of any masses, polyps, ulcerations, erosions, or vascular abnormalities. Retroflexed views revealed internal/external hemorrhoids.     The scope was then withdrawn from the patient and the procedure terminated.  COMPLICATIONS: There were no complications.  IMPRESSION:Erythematous was found  RECOMMENDATIONS: 1.  Await biopsy results 2.  Continue with antibiotics. 3.  Signing off.   _______________________________ Rosalie DoctorJeani Hawking, MD 10/15/2012 1:13 PM

## 2012-10-15 NOTE — Progress Notes (Signed)
Approximately 500cc left in Golytely jug.  When asked about stool consistancy, pt stated "my stools are clear and yellow."  Will continue to encourage pt to finish drink.

## 2012-10-15 NOTE — H&P (View-Only) (Signed)
Patient ID: Daniel Collier, male   DOB: 05/19/1961, 51 y.o.   MRN: [id number] [facility] PROGRESS NOTE  Olufemi Halm MRN:[id number] DOB: 06/12/1961 DOA: 10/13/2012 PCP: No primary provider on file.  Assessment/Plan:   Colitis.  Patient with 1 week diarrhea, BRBPR Thursday thru Sunday (no bleeding since Sunday), some abdominal cramping just before a bowel movement.Just 3 diarrhea BM overnight and now stool getting more formed.No abdominal pain. Tolerating regular diet  Guiac +  Hgb 14.0 (no previous blood work to compare it to)  Sister has a history of diverticulitis.  No other family history.  No personal history of previous symptoms.  Dr. Hung will see in consultation today (Thank you!)  Suspect infectious colitis-continue with Cipro/Flagyl, await GI work up, he is 51 years and will need a colonoscopy at some point in the near future-will defer to GI to decide on the timing  HTN  Will start low dose enalapril  Social  Will need PCP   Code Status: Full Family Communication: Multiple family members at bedside. Disposition Plan:  Home when medically appropriate.   Consultants:  Gastroenterology, Dr. Hung  Procedures:  none  Antibiotics:  Cipro, Flagyl  HPI/Subjective: Bleeding stopped Sunday.  3 diarrhea BM over night.  Objective: Filed Vitals:   10/14/12 0250 10/14/12 0300 10/14/12 0322 10/14/12 0411  BP:  140/87 140/87 140/94  Pulse:  88 91 83  Temp:      TempSrc:      Resp:   16 16  Height:    6' 1" (1.854 m)  Weight:    110.7 kg (244 lb 0.8 oz)  SpO2: 100% 95% 96% 97%   No intake or output data in the 24 hours ending 10/14/12 0936 Filed Weights   10/14/12 0411  Weight: 110.7 kg (244 lb 0.8 oz)    Exam:   General:  A&O, Non Toxic  Cardiovascular: RRR, no M/R/G  Respiratory: CTA, no W/C/R  Abdomen: Soft +BS, Nt, Mildly distended  Extremities:  No lower extremity edema  Data Reviewed: Basic Metabolic Panel:  Lab 10/14/12 0600  10/13/12 2232  NA 139 139  K 3.7 4.3  CL 105 100  CO2 23 24  GLUCOSE 105* 103*  BUN 12 16  CREATININE 1.08 1.30  CALCIUM 9.0 10.1  MG -- --  PHOS -- --   Liver Function Tests:  Lab 10/14/12 0600 10/13/12 2232  AST 18 25  ALT 34 46  ALKPHOS 59 70  BILITOT 0.6 0.4  PROT 7.1 9.0*  ALBUMIN 3.6 4.6    Lab 10/13/12 2232  LIPASE 32  AMYLASE --   CBC:  Lab 10/13/12 2232  WBC 10.1  NEUTROABS 5.9  HGB 14.0  HCT 41.8  MCV 97.4  PLT 280     Studies: Ct Abdomen Pelvis W Contrast  10/14/2012  *RADIOLOGY REPORT*  Clinical Data: Bloody diarrhea.  CT ABDOMEN AND PELVIS WITH CONTRAST  Technique:  Multidetector CT imaging of the abdomen and pelvis was performed following the standard protocol during bolus administration of intravenous contrast.  Contrast: 50mL OMNIPAQUE IOHEXOL 300 MG/ML  SOLN, 100mL OMNIPAQUE IOHEXOL 300 MG/ML  SOLN  Comparison: Acute abdominal series 10/13/2012.  Findings: The lung bases are clear without focal nodule, mass, or airspace disease.  The heart size is normal.  No significant pleural or pericardial effusion is present.  Mild fatty infiltration of the liver is evident.  To 6 mm hypodense lesions are present within the left lobe the liver, likely representing small cysts.  The   spleen is unremarkable.  The stomach, duodenum, pancreas are within normal limits.  Common bile duct and gallbladder are normal.  There is slight fullness of the adrenal glands without a focal lesion.  A simple cyst in the upper portion of the right kidney measures 13 mm.  At least four separate nonobstructing stones are present in the right kidney.  The largest is 4 mm. Two separate 4.5 mm nonobstructing stones are present in the left kidney.  The ureters are of normal size.  Urinary bladder is collapsed.  The prostate gland is enlarged, measuring 5.8 cm.  Wall thickening is present throughout the rectosigmoid colon. There is some wall thickening in the descending and distal transverse colon  as well.  There is collapse of the cecum and ascending colon.  Diverticular changes are noted in the descending and sigmoid colon.  The more proximal colon is unremarkable.  The appendix is visualized and within normal limits.  No free air or free fluid is evident.  There is no significant adenopathy.  The bone windows are unremarkable.  IMPRESSION:  1.  Extensive wall thickening throughout the distal colon suggesting a nonspecific colitis. 2.  Mild diverticular changes are present.  This does not appear to be the etiology of the colitis. 3.  Prostatic enlargement. 4.  Bilateral nonobstructing nephrolithiasis. 5.  Probable fatty infiltration of the liver.   Original Report Authenticated By: CHRISTOPHER W. MATTERN, M.D.    Dg Abd Acute W/chest  10/13/2012  *RADIOLOGY REPORT*  Clinical Data: Left flank pain.  ACUTE ABDOMEN SERIES (ABDOMEN 2 VIEW & CHEST 1 VIEW)  Comparison: None.  Findings: Single view chest demonstrates clear lungs and normal heart size.  No pneumothorax or pleural fluid.  Two views of the abdomen show no free intraperitoneal air. Multiple gas-filled and mildly dilated loops of small bowel seen in the left side of the abdomen.  There may be a small amount of gas in the transverse colon.  IMPRESSION: Findings worrisome for small bowel obstruction.   Original Report Authenticated By: Safi L. D'ALESSIO, M.D.     Scheduled Meds:   . sodium chloride   Intravenous Once  . sodium chloride   Intravenous STAT  . ciprofloxacin  400 mg Intravenous Q12H  . enalaprilat  1.25 mg Intravenous Once  . metronidazole  500 mg Intravenous Q8H  . DISCONTD: fentaNYL  100 mcg Intravenous Once   Continuous Infusions:   . sodium chloride 100 mL/hr (10/14/12 0610)    Principal Problem:  *Colitis Active Problems:  Elevated blood pressure    Time spent: 30 min.    Masoner, Marianne L  [facility] Pager [phone]. If 8PM-8AM, please contact night-coverage at [email or web address], password  [email or web address] 10/14/2012, 9:36 AM  LOS: 1 day     Attending  Seen and examined, agree with the assessment and plan as outlined above. He is doing much better, diarrhea has decreased and stools are getting much more formed, patient belly is soft and he is tolerating a regular diet. GI will see him today, if no w/u being planned inpatient (i.e colonoscopy or sigmoidoscopy)-he can be discharged on oral antibiotics. Await GI recommendations prior to planning for discharge  S Theodis Kinsel MD        

## 2012-10-15 NOTE — Progress Notes (Signed)
Daniel Collier discharged Home per MD order.  Discharge instructions reviewed and discussed with the patient, all questions and concerns answered. Copy of instructions and scripts given to patient. Explained to pt. How to sign up for Mychart.   [name], [name]  Home Medication Instructions [id number]   Printed on:10/15/12 1722  Medication Information                    ibuprofen (ADVIL,MOTRIN) 200 MG tablet Take 400 mg by mouth every 8 (eight) hours as needed. For pain           enalapril (VASOTEC) 5 MG tablet Take 1 tablet (5 mg total) by mouth daily.           ciprofloxacin (CIPRO) 500 MG tablet Take 1 tablet (500 mg total) by mouth 2 (two) times daily.           metroNIDAZOLE (FLAGYL) 500 MG tablet Take 1 tablet (500 mg total) by mouth 3 (three) times daily.             Patients skin is clean, dry and intact, no evidence of skin break down. IV site discontinued and catheter remains intact. Site without signs and symptoms of complications. Dressing and pressure applied.  Patient will be escorted to car by NT in a wheelchair,  no distress noted upon discharge.  [name] 10/15/2012 5:22 PM

## 2012-10-15 NOTE — Plan of Care (Signed)
Problem: Phase I Progression Outcomes Goal: Initial discharge plan identified Outcome: Completed/Met Date Met:  10/15/12 To reture home

## 2012-10-15 NOTE — Interval H&P Note (Signed)
History and Physical Interval Note:  10/15/2012 12:36 PM  Daniel Collier  has presented today for surgery, with the diagnosis of Colitis.  The various methods of treatment have been discussed with the patient and family. After consideration of risks, benefits and other options for treatment, the patient has consented to  Procedure(s) (LRB) with comments: COLONOSCOPY (N/A) as a surgical intervention .  The patient's history has been reviewed, patient examined, no change in status, stable for surgery.  I have reviewed the patient's chart and labs.  Questions were answered to the patient's satisfaction.     Khoury Siemon D

## 2012-10-15 NOTE — Discharge Summary (Signed)
Physician Discharge Summary  Daniel Collier ZOX:[id number] DOB: 1961/02/28 DOA: 10/13/2012  PCP: Quitman Livings, MD  Admit date: 10/13/2012 Discharge date: 10/15/2012  Time spent: 40 minutes  Recommendations for Outpatient Follow-up:  1. Colonoscopy biopsy results pending 2. Monitor blood pressure. Was started on enalapril as an inpatient. 3. Monitor hemoglobin. Recent history of GI bleeding.  Discharge Diagnoses:  Principal Problem:  *Colitis Active Problems:  Elevated blood pressure   Discharge Condition: stable  Diet recommendation: Soft diet until feeling back to normal.  Filed Weights   10/14/12 0411  Weight: 110.7 kg (244 lb 0.8 oz)    History of present illness:  51 yo AA male with history of Kidney stones and hypertension.  Presented to the ED 10/28 with diarrhea for 1 week. He describes crampy abdominal pain immediately preceding his bowel movements. From October 24 through October 27 he experienced bright red blood per rectum with each bowel movement. During that timeframe the amount of blood progressively increased and eventually turned into large clots. He has had no more bleeding since Sunday, October 27.  Hospital Course:   Colitis The patient's diarrhea bowel movements slowed slightly during the hospitalization. His abdominal pain decreased. He was treated with IV Cipro and Flagyl during his stay.  He was C. difficile PCR negative, and stool studies showed no suspicious colonies but were still pending at the time of discharge. Gastroenterology was consulted on October 29. They felt it was best to move forward with colonoscopy during this hospitalization. The patient underwent colonoscopy with Dr. Jeani Hawking on October 30. There were no complications from the procedure.  Colonoscopy findings included very minimal and patchy erythema noted in the colon. Random cold biopsies were obtained. No evidence of any masses, polyps, ulcerations, arrangements or vascular  abnormalities.  Daniel Collier will be discharged with prescriptions for oral cipro and flagyl to complete a 10 day antibiotic course.  He will follow up with Dr. Elnoria Howard regarding the colonoscopy biopsies.  Hypertension Daniel Collier SBP was consistently 150 - 170s. He was started on enalapril.  We have arranged for him to follow with Dr. Roseanne Reno as a PCP.  Procedures:  Colonoscopy 10/30  Consultations:  Dr. Jeani Hawking, Gastroenterology  Discharge Exam: Filed Vitals:   10/15/12 1305 10/15/12 1313 10/15/12 1315 10/15/12 1330  BP: 124/79 122/83 122/83 126/90  Pulse:      Temp:  98.1 F (36.7 C)    TempSrc:  Oral    Resp: 18 15 17 16   Height:      Weight:      SpO2: 96% 94% 94% 96%    General: A&O NAD, awaiting colonoscopy Cardiovascular: RRR, No M/R/G Respiratory: CTA, No W/C/R Abdomen:  Obese, soft, nt, nd, +BS  Discharge Instructions      Discharge Orders    Future Orders Please Complete By Expires   Diet general      Comments:   Low Fiber until feeling better.  Mashed potatoes, noodles, soft foods, no salads or raw vegetables.   Increase activity slowly          Medication List     As of 10/15/2012  2:53 PM    TAKE these medications         ciprofloxacin 500 MG tablet   Commonly known as: CIPRO   Take 1 tablet (500 mg total) by mouth 2 (two) times daily.      enalapril 5 MG tablet   Commonly known as: VASOTEC   Take 1 tablet (5 mg total)  by mouth daily.      ibuprofen 200 MG tablet   Commonly known as: ADVIL,MOTRIN   Take 400 mg by mouth every 8 (eight) hours as needed. For pain      metroNIDAZOLE 500 MG tablet   Commonly known as: FLAGYL   Take 1 tablet (500 mg total) by mouth 3 (three) times daily.        Follow-up Information    Follow up with [facility], [place]. On 10/25/2012. (12:30, initial visit is $50, bring id and meds)    Contact information:   [address] [place] [address] [phone]           The results of  significant diagnostics from this hospitalization (including imaging, microbiology, ancillary and laboratory) are listed below for reference.    Significant Diagnostic Studies: Ct Abdomen Pelvis W Contrast  10/14/2012  *RADIOLOGY REPORT*  Clinical Data: Bloody diarrhea.  CT ABDOMEN AND PELVIS WITH CONTRAST  Technique:  Multidetector CT imaging of the abdomen and pelvis was performed following the standard protocol during bolus administration of intravenous contrast.  Contrast: 50mL OMNIPAQUE IOHEXOL 300 MG/ML  SOLN, OMNIPAQUE IOHEXOL 300 MG/ML  SOLN  Comparison: Acute abdominal series 10/13/2012.  Findings: The lung bases are clear without focal nodule, mass, or airspace disease.  The heart size is normal.  No significant pleural or pericardial effusion is present.  Mild fatty infiltration of the liver is evident.  To 6 mm hypodense lesions are present within the left lobe the liver, likely representing small cysts.  The spleen is unremarkable.  The stomach, duodenum, pancreas are within normal limits.  Common bile duct and gallbladder are normal.  There is slight fullness of the adrenal glands without a focal lesion.  A simple cyst in the upper portion of the right kidney measures 13 mm.  At least four separate nonobstructing stones are present in the right kidney.  The largest is 4 mm. Two separate 4.5 mm nonobstructing stones are present in the left kidney.  The ureters are of normal size.  Urinary bladder is collapsed.  The prostate gland is enlarged, measuring 5.8 cm.  Wall thickening is present throughout the rectosigmoid colon. There is some wall thickening in the descending and distal transverse colon as well.  There is collapse of the cecum and ascending colon.  Diverticular changes are noted in the descending and sigmoid colon.  The more proximal colon is unremarkable.  The appendix is visualized and within normal limits.  No free air or free fluid is evident.  There is no significant  adenopathy.  The bone windows are unremarkable.  IMPRESSION:  1.  Extensive wall thickening throughout the distal colon suggesting a nonspecific colitis. 2.  Mild diverticular changes are present.  This does not appear to be the etiology of the colitis. 3.  Prostatic enlargement. 4.  Bilateral nonobstructing nephrolithiasis. 5.  Probable fatty infiltration of the liver.   Original Report Authenticated By: Jamesetta Orleans. MATTERN, M.D.    Dg Abd Acute W/chest  10/13/2012  *RADIOLOGY REPORT*  Clinical Data: Left flank pain.  ACUTE ABDOMEN SERIES (ABDOMEN 2 VIEW & CHEST 1 VIEW)  Comparison: None.  Findings: Single view chest demonstrates clear lungs and normal heart size.  No pneumothorax or pleural fluid.  Two views of the abdomen show no free intraperitoneal air. Multiple gas-filled and mildly dilated loops of small bowel seen in the left side of the abdomen.  There may be a small amount of gas in  the transverse colon.  IMPRESSION: Findings worrisome for small bowel obstruction.   Original Report Authenticated By: Bernadene Bell. Maricela Curet, M.D.     Microbiology: Recent Results (from the past 240 hour(s))  CLOSTRIDIUM DIFFICILE BY PCR     Status: Normal   Collection Time   10/13/12 11:56 PM      Component Value Range Status Comment   C difficile by pcr NEGATIVE  NEGATIVE Final   STOOL CULTURE     Status: Normal (Preliminary result)   Collection Time   10/13/12 11:57 PM      Component Value Range Status Comment   Specimen Description STOOL   Final    Special Requests NONE   Final    Culture NO SUSPICIOUS COLONIES, CONTINUING TO HOLD   Final    Report Status PENDING   Incomplete   STOOL CULTURE     Status: Normal (Preliminary result)   Collection Time   10/14/12 10:49 AM      Component Value Range Status Comment   Specimen Description STOOL   Final    Special Requests NONE   Final    Culture Culture reincubated for better growth   Final    Report Status PENDING   Incomplete      Labs: Basic  Metabolic Panel:  Lab 10/15/12 1610 10/14/12 0600 10/13/12 2232  NA 134* 139 139  K 3.5 3.7 4.3  CL 101 105 100  CO2 23 23 24   GLUCOSE 96 105* 103*  BUN 11 12 16   CREATININE 1.08 1.08 1.30  CALCIUM 9.0 9.0 10.1  MG -- -- --  PHOS -- -- --   Liver Function Tests:  Lab 10/14/12 0600 10/13/12 2232  AST 18 25  ALT 34 46  ALKPHOS 59 70  BILITOT 0.6 0.4  PROT 7.1 9.0*  ALBUMIN 3.6 4.6    Lab 10/13/12 2232  LIPASE 32  AMYLASE --   CBC:  Lab 10/15/12 0600 10/13/12 2232  WBC 6.1 10.1  NEUTROABS 3.5 5.9  HGB 11.9* 14.0  HCT 35.6* 41.8  MCV 97.8 97.4  PLT 244 280    Signed:  Yaman, Grauberger  [facility] 10/15/2012, 2:53 PM

## 2012-10-16 ENCOUNTER — Encounter (HOSPITAL_COMMUNITY): Payer: Self-pay | Admitting: Gastroenterology

## 2012-10-17 ENCOUNTER — Encounter (HOSPITAL_COMMUNITY): Payer: Self-pay

## 2012-10-17 LAB — STOOL CULTURE

## 2012-10-18 LAB — STOOL CULTURE

## 2014-01-24 IMAGING — CT CT ABD-PELV W/ CM
2 of 5 series · 16 of 46 positions shown, 18 images · IV contrast (omnipaque)
Comparison: Acute abdominal series 10/13/2012.

CLINICAL DATA: Bloody diarrhea.

CT ABDOMEN AND PELVIS WITH CONTRAST
TECHNIQUE: Multidetector CT imaging of the abdomen and pelvis was
performed following the standard protocol during bolus
administration of intravenous contrast.
Contrast: 50mL OMNIPAQUE IOHEXOL 300 MG/ML  SOLN, 100mL OMNIPAQUE
IOHEXOL 300 MG/ML  SOLN

[Series 2: abd/pelvis 5.0 b31f · axial · 0.77mm/px · z∈[-529,-104]mm · 13 of 96 slices shown, 15 images]
[im 6/96  soft-tissue]
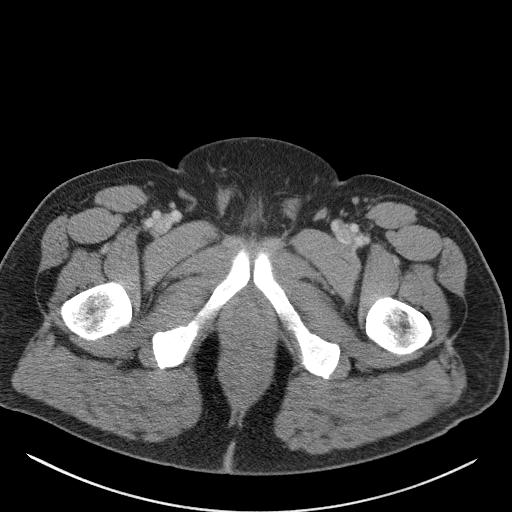
[im 6/96  bone]
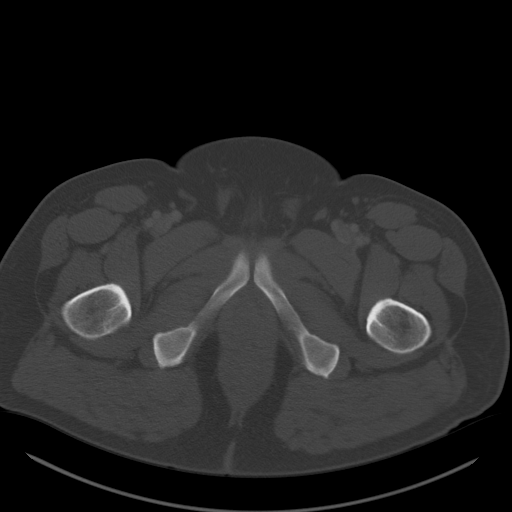
[im 16/96  soft-tissue]
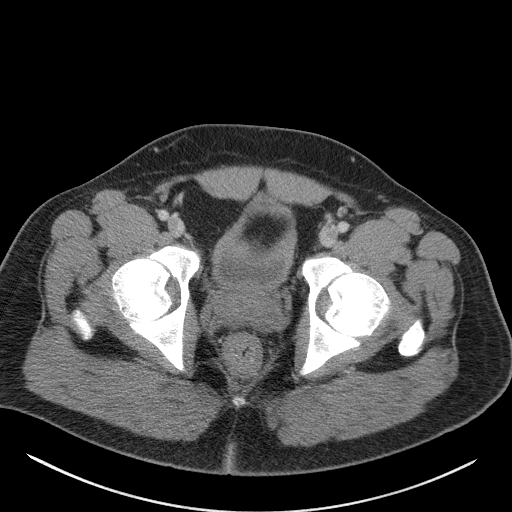
[im 21/96  soft-tissue]
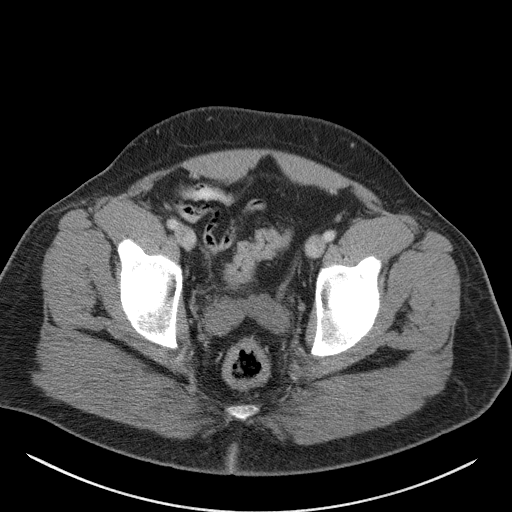
[im 26/96  soft-tissue]
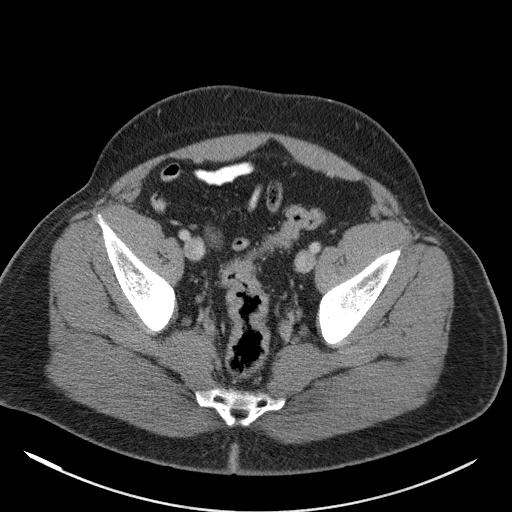
[im 36/96  soft-tissue]
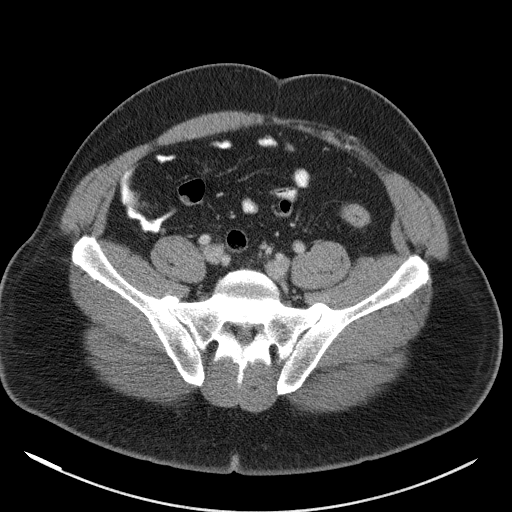
[im 41/96  soft-tissue]
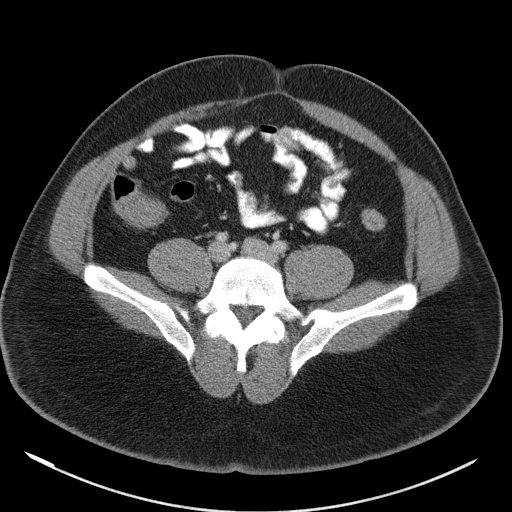
[im 51/96  soft-tissue]
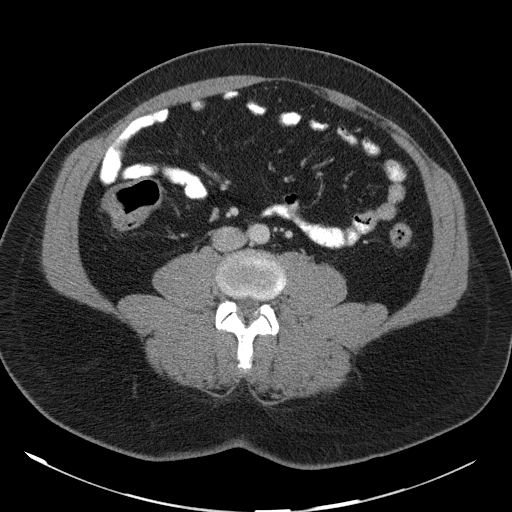
[im 56/96  soft-tissue]
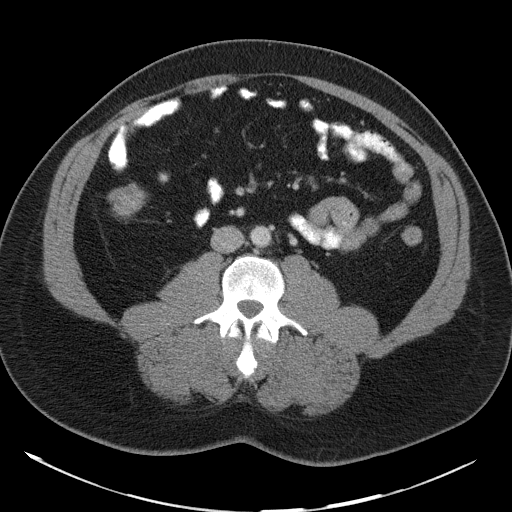
[im 61/96  soft-tissue]
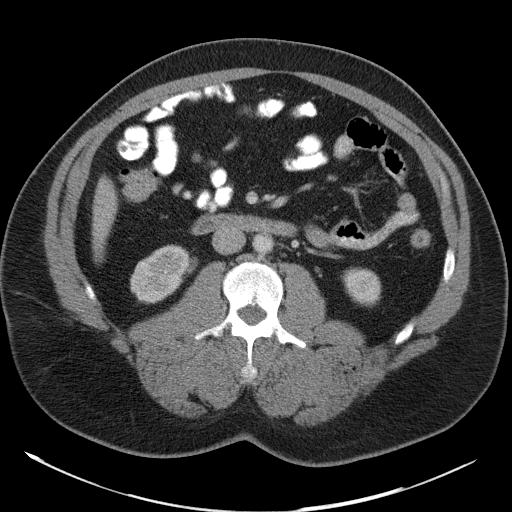
[im 61/96  bone]
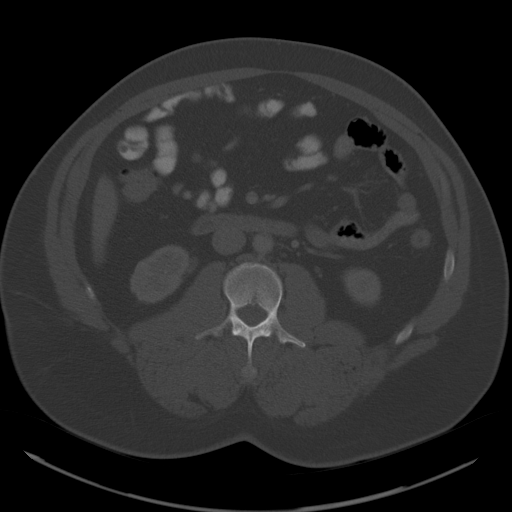
[im 71/96  soft-tissue]
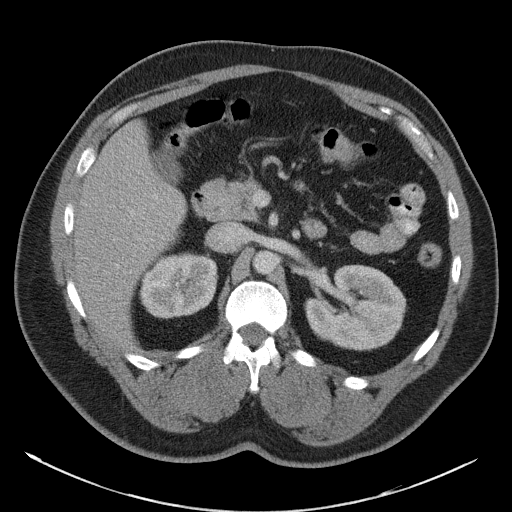
[im 76/96  soft-tissue]
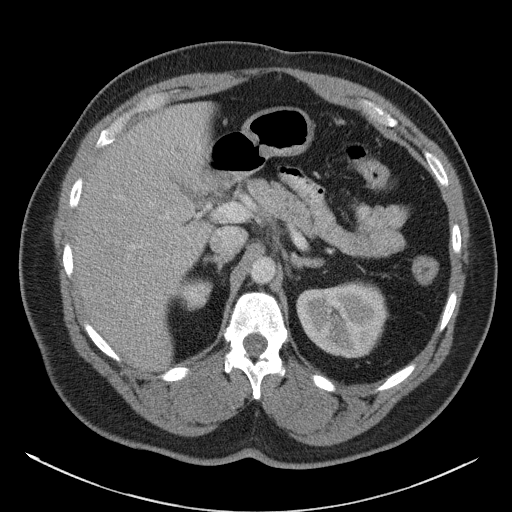
[im 81/96  soft-tissue]
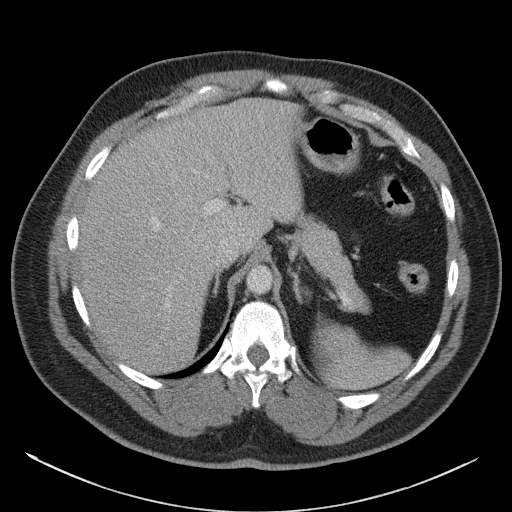
[im 91/96  soft-tissue]
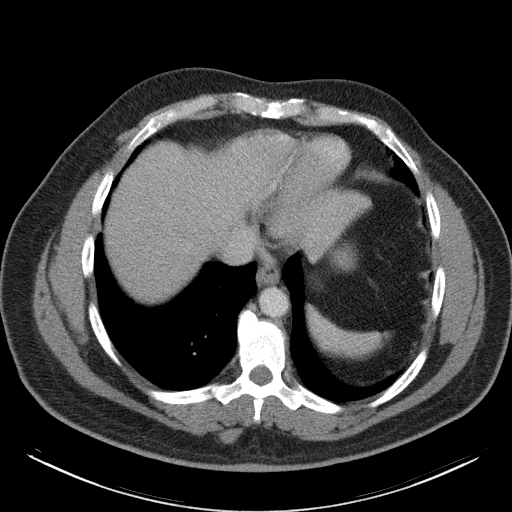

[Series 5: abd/pelvis 3.0 coronal · coronal · 0.85mm/px · 3 of 103 slices shown]
[im 35/103  soft-tissue]
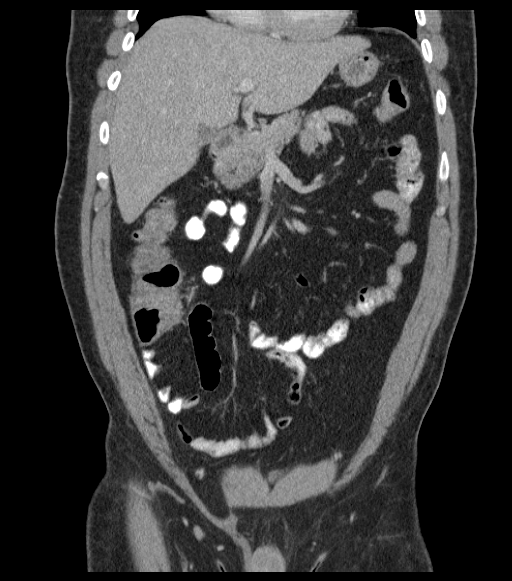
[im 46/103  soft-tissue]
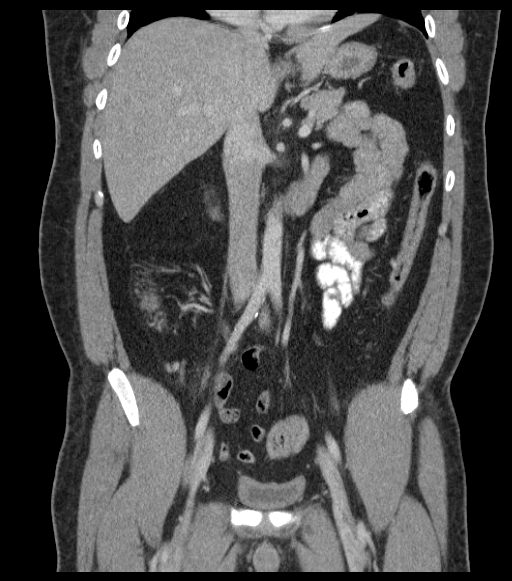
[im 57/103  soft-tissue]
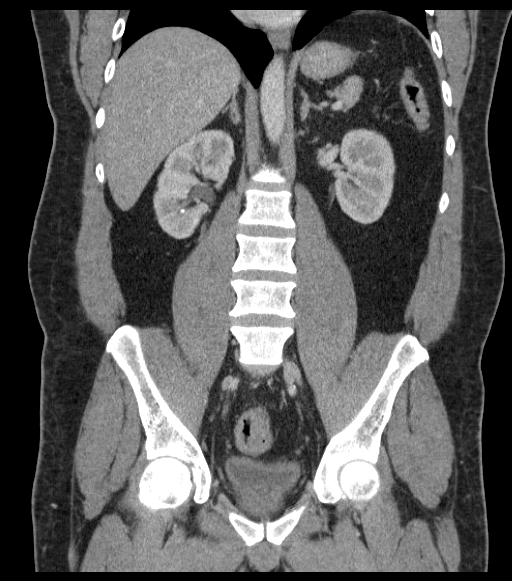

[16 of 46 positions shown; findings below may reference images not displayed]

FINDINGS: The lung bases are clear without focal nodule, mass, or
airspace disease.  The heart size is normal.  No significant
pleural or pericardial effusion is present.

Mild fatty infiltration of the liver is evident.  To 6 mm hypodense
lesions are present within the left lobe the liver, likely
representing small cysts.  The spleen is unremarkable.  The
stomach, duodenum, pancreas are within normal limits.  Common bile
duct and gallbladder are normal.  There is slight fullness of the
adrenal glands without a focal lesion.

A simple cyst in the upper portion of the right kidney measures 13
mm.  At least four separate nonobstructing stones are present in
the right kidney.  The largest is 4 mm. Two separate 4.5 mm
nonobstructing stones are present in the left kidney.  The ureters
are of normal size.  Urinary bladder is collapsed.  The prostate
gland is enlarged, measuring 5.8 cm.

Wall thickening is present throughout the rectosigmoid colon.
There is some wall thickening in the descending and distal
transverse colon as well.  There is collapse of the cecum and
ascending colon.  Diverticular changes are noted in the descending
and sigmoid colon.  The more proximal colon is unremarkable.  The
appendix is visualized and within normal limits.

No free air or free fluid is evident.  There is no significant
adenopathy.

The bone windows are unremarkable.
IMPRESSION: 1.  Extensive wall thickening throughout the distal colon
suggesting a nonspecific colitis.
2.  Mild diverticular changes are present.  This does not appear to
be the etiology of the colitis.
3.  Prostatic enlargement.
4.  Bilateral nonobstructing nephrolithiasis.
5.  Probable fatty infiltration of the liver.

## 2022-03-09 ENCOUNTER — Emergency Department (HOSPITAL_BASED_OUTPATIENT_CLINIC_OR_DEPARTMENT_OTHER): Payer: BLUE CROSS/BLUE SHIELD

## 2022-03-09 ENCOUNTER — Other Ambulatory Visit: Payer: Self-pay

## 2022-03-09 ENCOUNTER — Encounter (HOSPITAL_BASED_OUTPATIENT_CLINIC_OR_DEPARTMENT_OTHER): Payer: Self-pay | Admitting: Emergency Medicine

## 2022-03-09 DIAGNOSIS — R0789 Other chest pain: Secondary | ICD-10-CM | POA: Insufficient documentation

## 2022-03-09 DIAGNOSIS — I10 Essential (primary) hypertension: Secondary | ICD-10-CM | POA: Insufficient documentation

## 2022-03-09 LAB — CBC
HCT: 41.9 % (ref 39.0–52.0)
Hemoglobin: 13.9 g/dL (ref 13.0–17.0)
MCH: 32.6 pg (ref 26.0–34.0)
MCHC: 33.2 g/dL (ref 30.0–36.0)
MCV: 98.4 fL (ref 80.0–100.0)
Platelets: 261 10*3/uL (ref 150–400)
RBC: 4.26 MIL/uL (ref 4.22–5.81)
RDW: 13 % (ref 11.5–15.5)
WBC: 7.5 10*3/uL (ref 4.0–10.5)
nRBC: 0 % (ref 0.0–0.2)

## 2022-03-09 LAB — BASIC METABOLIC PANEL
Anion gap: 9 (ref 5–15)
BUN: 16 mg/dL (ref 6–20)
CO2: 24 mmol/L (ref 22–32)
Calcium: 9.6 mg/dL (ref 8.9–10.3)
Chloride: 104 mmol/L (ref 98–111)
Creatinine, Ser: 1.75 mg/dL — ABNORMAL HIGH (ref 0.61–1.24)
GFR, Estimated: 44 mL/min — ABNORMAL LOW (ref >60)
Glucose, Bld: 178 mg/dL — ABNORMAL HIGH (ref 70–99)
Potassium: 3.6 mmol/L (ref 3.5–5.1)
Sodium: 137 mmol/L (ref 135–145)

## 2022-03-09 MED ORDER — ACETAMINOPHEN 325 MG PO TABS
650.0000 mg | ORAL_TABLET | Freq: Once | ORAL | Status: DC | PRN
  Filled 2022-03-09: qty 2

## 2022-03-09 NOTE — ED Triage Notes (Signed)
CP and SOB X 2 weeks worse today, worse with movement. Was seen by PCP on Monday EKG was done looked OK.   ?

## 2022-03-10 ENCOUNTER — Emergency Department (HOSPITAL_BASED_OUTPATIENT_CLINIC_OR_DEPARTMENT_OTHER)
Admission: EM | Admit: 2022-03-10 | Discharge: 2022-03-10 | Disposition: A | Discharge status: Home / Self Care | Payer: BLUE CROSS/BLUE SHIELD | Attending: Emergency Medicine | Admitting: Emergency Medicine

## 2022-03-10 DIAGNOSIS — R0789 Other chest pain: Secondary | ICD-10-CM

## 2022-03-10 LAB — TROPONIN I (HIGH SENSITIVITY)
Troponin I (High Sensitivity): 15 ng/L (ref <18)
Troponin I (High Sensitivity): 17 ng/L (ref <18)

## 2022-03-10 LAB — D-DIMER, QUANTITATIVE: D-Dimer, Quant: 0.27 ug/mL-FEU (ref 0.00–0.50)

## 2022-03-10 MED ORDER — NAPROXEN 250 MG PO TABS
500.0000 mg | ORAL_TABLET | Freq: Once | ORAL | Status: AC
  Administered 2022-03-10: 500 mg via ORAL
  Filled 2022-03-10: qty 2

## 2022-03-10 NOTE — ED Provider Notes (Signed)
? ?[facility] ?Provider Note: Lowella Dell, MD, FACEP ? ?CSN: [id number] ?MRN: [id number] ?ARRIVAL: 03/09/22 at 2204 ?ROOM: [facility] ? ? ?CHIEF COMPLAINT  ?Chest Pain ? ? ?HISTORY OF PRESENT ILLNESS  ?03/10/22 12:49 AM ?Daniel Collier is a 61 y.o. male who has had right-sided chest pain off and on for the past 2 weeks.  He describes the pain as a fullness and it involves the entire right side of his anterior chest.  Nothing makes the pain better or worse (not worse with palpation, movement, deep breathing or cough).  He denies shortness of breath, diaphoresis or nausea.  He was seen by his PCP 5 days ago and an EKG at that time was unremarkable.  On arrival here he was noted to have a temperature of 100.5 and he had been having chills earlier. ? ? ?Past Medical History:  ?Diagnosis Date  ? Hypertension   ? Kidney stone   ? s/p lithotripsy  ? ? ?Past Surgical History:  ?Procedure Laterality Date  ? COLONOSCOPY  10/15/2012  ? Procedure: COLONOSCOPY;  Surgeon: Theda Belfast, MD;  Location: [facility] ENDOSCOPY;  Service: Endoscopy;  Laterality: N/A;  ? TONSILLECTOMY    ? ? ?Family History  ?Problem Relation Age of Onset  ? Other Neg Hx   ? ? ?Social History  ? ?Tobacco Use  ? Smoking status: Never  ?Substance Use Topics  ? Alcohol use: No  ? Drug use: No  ? ? ?Prior to Admission medications   ?Medication Sig Start Date End Date Taking? Authorizing Provider  ?enalapril (VASOTEC) 5 MG tablet Take 1 tablet (5 mg total) by mouth daily. 10/15/12   Dellinger, Tora Kindred, PA-C  ? ? ?Allergies ?Patient has no known allergies. ? ? ?REVIEW OF SYSTEMS  ?Negative except as noted here or in the History of Present Illness. ? ? ?PHYSICAL EXAMINATION  ?Initial Vital Signs ?Blood pressure (!) 142/78, pulse (!) 120, temperature 98.5 ?F (36.9 ?C), temperature source Oral, resp. rate (!) 22, height 6' (1.829 m), weight 113.9 kg, SpO2 90 %. ? ?Examination ?General: Well-developed, well-nourished male in no acute distress; appearance  consistent with age of record ?HENT: normocephalic; atraumatic ?Eyes: Normal appearance ?Neck: supple ?Heart: regular rate and rhythm; tachycardia ?Lungs: clear to auscultation bilaterally ?Abdomen: soft; nondistended; nontender; bowel sounds present ?Extremities: No deformity; full range of motion ?Neurologic: Awake, alert and oriented; motor function intact in all extremities and symmetric; no facial droop ?Skin: Warm and dry ?Psychiatric: Normal mood and affect ? ? ?RESULTS  ?Summary of this visit's results, reviewed and interpreted by myself: ? ? EKG Interpretation ? ?Date/Time:  Friday March 09 2022 22:12:18 EDT ?Ventricular Rate:  125 ?PR Interval:  138 ?QRS Duration: 94 ?QT Interval:  322 ?QTC Calculation: 464 ?R Axis:   26 ?Text Interpretation: Sinus tachycardia Otherwise normal ECG No previous ECGs available Confirmed by Anisia Leija, Jonny Ruiz ([id number]) on 03/10/2022 12:24:24 AM ?  ? ?  ? ?Laboratory Studies: ?Results for orders placed or performed during the hospital encounter of 03/10/22 (from the past 24 hour(s))  ?Basic metabolic panel     Status: Abnormal  ? Collection Time: 03/09/22 10:31 PM  ?Result Value Ref Range  ? Sodium 137 135 - 145 mmol/L  ? Potassium 3.6 3.5 - 5.1 mmol/L  ? Chloride 104 98 - 111 mmol/L  ? CO2 24 22 - 32 mmol/L  ? Glucose, Bld 178 (H) 70 - 99 mg/dL  ? BUN 16 6 - 20 mg/dL  ? Creatinine, Ser 1.75 (  H) 0.61 - 1.24 mg/dL  ? Calcium 9.6 8.9 - 10.3 mg/dL  ? GFR, Estimated 44 (L) >60 mL/min  ? Anion gap 9 5 - 15  ?CBC     Status: None  ? Collection Time: 03/09/22 10:31 PM  ?Result Value Ref Range  ? WBC 7.5 4.0 - 10.5 K/uL  ? RBC 4.26 4.22 - 5.81 MIL/uL  ? Hemoglobin 13.9 13.0 - 17.0 g/dL  ? HCT 41.9 39.0 - 52.0 %  ? MCV 98.4 80.0 - 100.0 fL  ? MCH 32.6 26.0 - 34.0 pg  ? MCHC 33.2 30.0 - 36.0 g/dL  ? RDW 13.0 11.5 - 15.5 %  ? Platelets 261 150 - 400 K/uL  ? nRBC 0.0 0.0 - 0.2 %  ?Troponin I (High Sensitivity)     Status: None  ? Collection Time: 03/09/22 10:31 PM  ?Result Value Ref Range  ?  Troponin I (High Sensitivity) 15 <18 ng/L  ?Troponin I (High Sensitivity)     Status: None  ? Collection Time: 03/10/22  1:00 AM  ?Result Value Ref Range  ? Troponin I (High Sensitivity) 17 <18 ng/L  ?D-dimer, quantitative     Status: None  ? Collection Time: 03/10/22  1:00 AM  ?Result Value Ref Range  ? D-Dimer, Quant 0.27 0.00 - 0.50 ug/mL-FEU  ? ?Imaging Studies: ?DG Chest 2 View ? ?Result Date: 03/09/2022 ?CLINICAL DATA:  Worsening chest pain for 2 weeks EXAM: CHEST - 2 VIEW COMPARISON:  06/08/2020 FINDINGS: Frontal and lateral views of the chest demonstrate an unremarkable cardiac silhouette. No acute airspace disease, effusion, or pneumothorax. No acute bony abnormalities. IMPRESSION: 1. No acute intrathoracic process. Electronically Signed   By: Sharlet Salina M.D.   On: 03/09/2022 22:59   ? ?ED COURSE and MDM  ?Nursing notes, initial and subsequent vitals signs, including pulse oximetry, reviewed and interpreted by myself. ? ?Vitals:  ? 03/10/22 0100 03/10/22 0115 03/10/22 0200 03/10/22 0230  ?BP: 123/60 128/65 131/68 127/79  ?Pulse: (!) 108 (!) 107 99 98  ?Resp: 20 20 16 18   ?Temp:      ?TempSrc:      ?SpO2: 94% 94% 90% 90%  ?Weight:      ?Height:      ? ?Medications  ?acetaminophen (TYLENOL) tablet 650 mg (has no administration in time range)  ?naproxen (NAPROSYN) tablet 500 mg (has no administration in time range)  ? ?The cause of the patient's chest pain is unclear but does not appear to be cardiac or a pulmonary embolism.  The cause for his low-grade fever is unclear clear but his tachycardia has resolved with resolution of the fever. ? ? ?PROCEDURES  ?Procedures ? ? ?ED DIAGNOSES  ? ?  ICD-10-CM   ?1. Atypical chest pain  R07.89   ?  ? ? ? ?  ?02-24-1987, MD ?03/10/22 0247 ? ?

## 2023-06-20 IMAGING — CR DG CHEST 2V
2 series · 2 of 2 positions shown · non-contrast
Comparison: 06/08/2020

CLINICAL DATA: Worsening chest pain for 2 weeks

EXAM:
CHEST - 2 VIEW

[w chest pa]
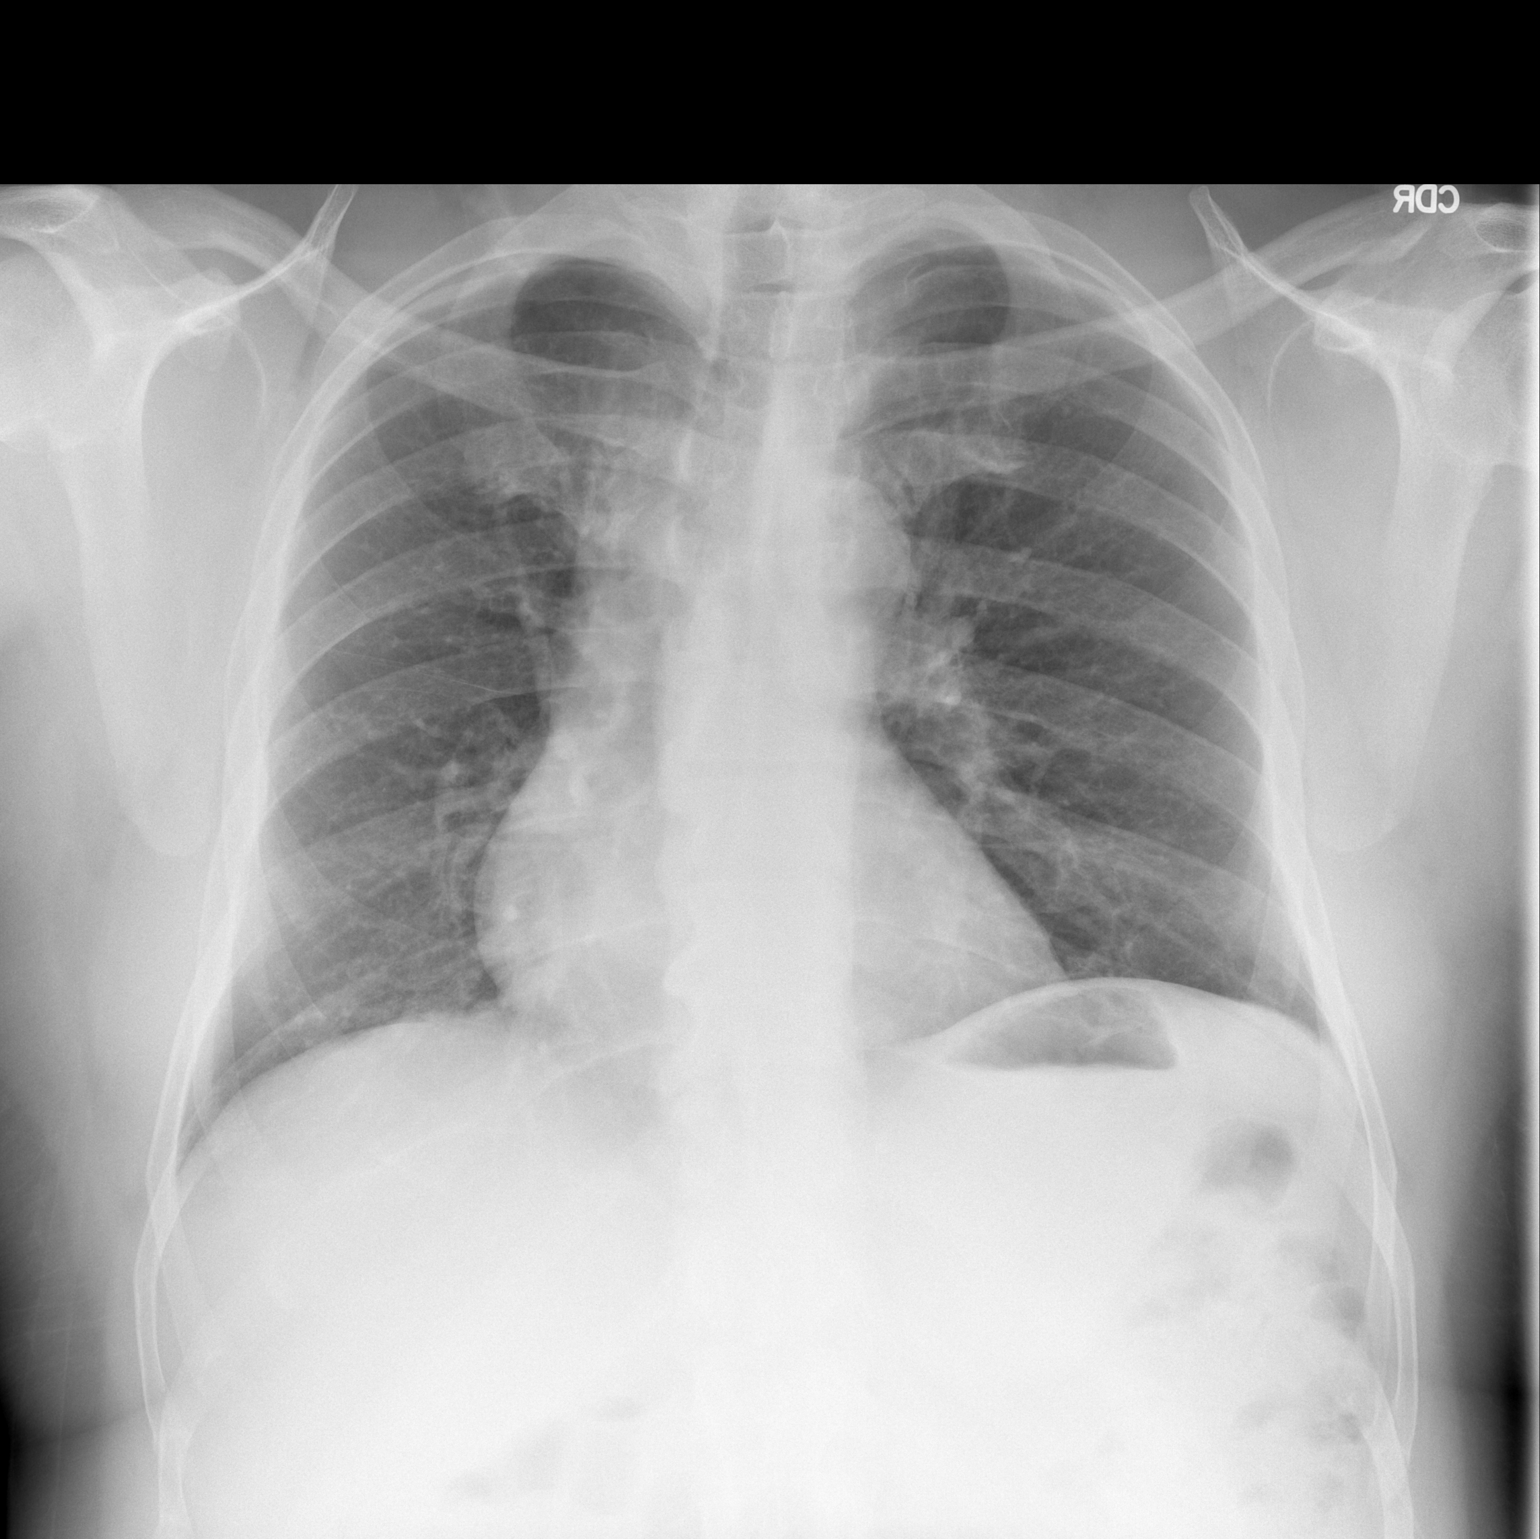

[w chest lat]
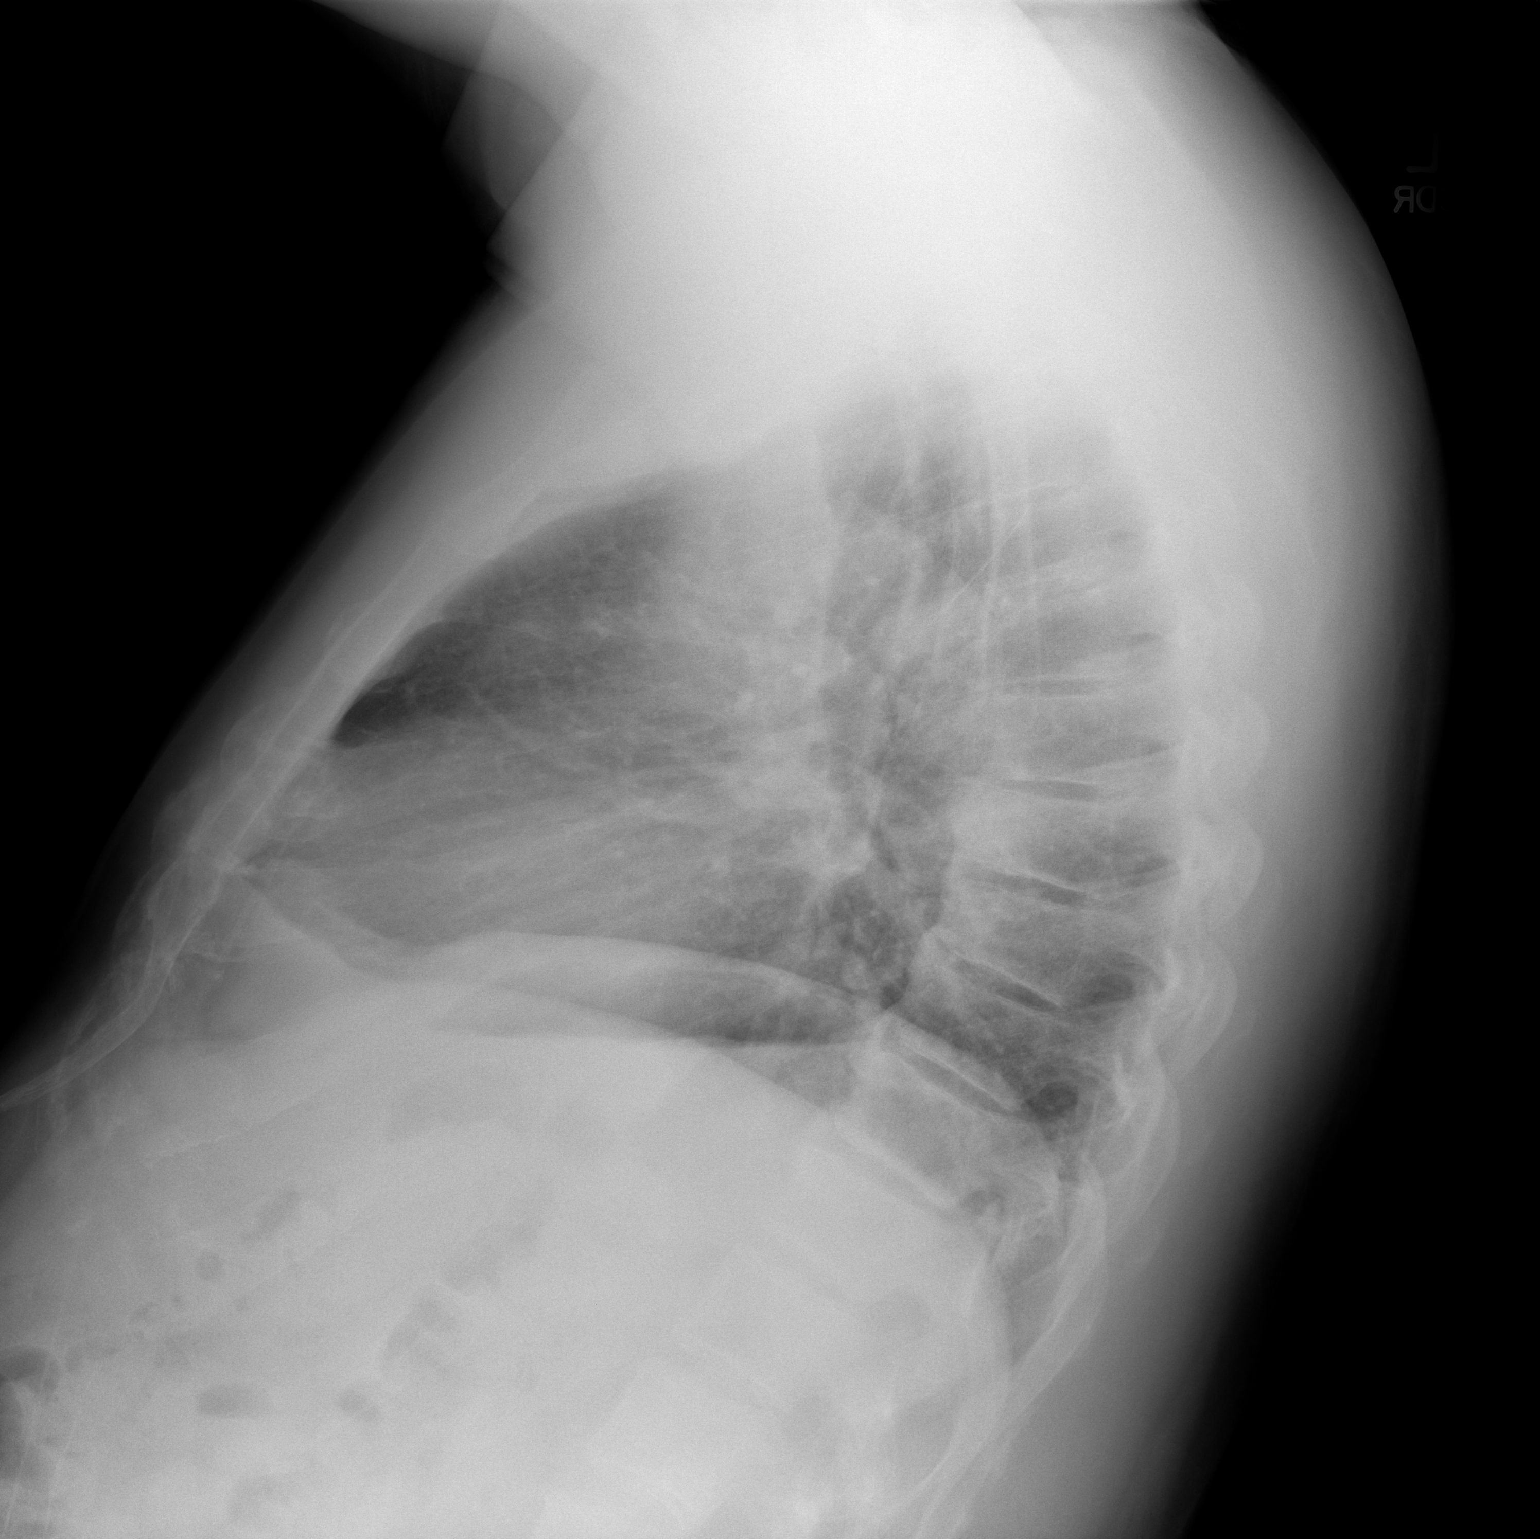

[2 of 2 positions shown; findings below may reference images not displayed]

FINDINGS: Frontal and lateral views of the chest demonstrate an unremarkable
cardiac silhouette. No acute airspace disease, effusion, or
pneumothorax. No acute bony abnormalities.
IMPRESSION: 1. No acute intrathoracic process.

## 2023-12-12 ENCOUNTER — Ambulatory Visit
Admission: EM | Admit: 2023-12-12 | Discharge: 2023-12-12 | Disposition: A | Discharge status: Home / Self Care | Payer: BLUE CROSS/BLUE SHIELD | Attending: Family Medicine | Admitting: Family Medicine

## 2023-12-12 ENCOUNTER — Encounter: Payer: Self-pay | Admitting: Emergency Medicine

## 2023-12-12 ENCOUNTER — Ambulatory Visit: Payer: BLUE CROSS/BLUE SHIELD

## 2023-12-12 DIAGNOSIS — M109 Gout, unspecified: Secondary | ICD-10-CM | POA: Diagnosis not present

## 2023-12-12 DIAGNOSIS — M25561 Pain in right knee: Secondary | ICD-10-CM

## 2023-12-12 MED ORDER — PREDNISONE 50 MG PO TABS: ORAL_TABLET | ORAL | 0 refills | Status: AC

## 2023-12-12 MED ORDER — OXYCODONE HCL 5 MG PO TABS: 5.0000 mg | ORAL_TABLET | Freq: Four times a day (QID) | ORAL | 0 refills | Status: AC | PRN

## 2023-12-12 MED ORDER — METHYLPREDNISOLONE SODIUM SUCC 125 MG IJ SOLR
80.0000 mg | Freq: Once | INTRAMUSCULAR | Status: AC
  Administered 2023-12-12: 80 mg via INTRAMUSCULAR

## 2023-12-12 NOTE — ED Provider Notes (Addendum)
[facility]    CSN: [id number] Arrival date & time: 12/12/23  1732      History   Chief Complaint Chief Complaint  Patient presents with   Knee Pain    HPI Daniel Collier is a 62 y.o. male.   Patient has a history of gout.  He has a swollen painful knee on the right.  It is very painful for him.  He has had no accident or injury.  He has had no twisting.  No change in activity.  He feels like this is gout.  It is hot to touch.  He has tried taking prednisone he had leftover from an old prescription but has been taking it intermittently.  He was on allopurinol for kidney stone prevention but then stopped this medication.  He tried going back on it when the gout flared up, but it has not yet helped with his knee pain.  The knee pain is keeping him awake at night.    Past Medical History:  Diagnosis Date   Hypertension    Kidney stone    s/p lithotripsy    Patient Active Problem List   Diagnosis Date Noted   Colitis 10/14/2012   Elevated blood pressure 10/14/2012    Past Surgical History:  Procedure Laterality Date   COLONOSCOPY  10/15/2012   Procedure: COLONOSCOPY;  Surgeon: Theda Belfast, MD;  [facility] ENDOSCOPY;  Service: Endoscopy;  Laterality: N/A;   TONSILLECTOMY         Home Medications    Prior to Admission medications   Medication Sig Start Date End Date Taking? Authorizing Provider  allopurinol (ZYLOPRIM) 300 MG tablet Take 300 mg by mouth daily. 05/16/22  Yes [provider]  atorvastatin (LIPITOR) 80 MG tablet Take 80 mg by mouth daily. 03/05/22  Yes [provider]  EPINEPHrine 0.3 mg/0.3 mL IJ SOAJ injection Inject 0.3 mg into the muscle as needed for anaphylaxis. 06/09/20  Yes [provider]  fluticasone (FLONASE) 50 MCG/ACT nasal spray Place 2 sprays into both nostrils daily. 02/24/16  Yes [provider]  oxyCODONE (ROXICODONE) 5 MG immediate release tablet Take 1-2 tablets (5-10 mg total) by mouth  every 6 (six) hours as needed for severe pain (pain score 7-10). 12/12/23  Yes Eustace Moore, MD  predniSONE (DELTASONE) 50 MG tablet Take once a day for 5 days.  Take with food 12/12/23  Yes Eustace Moore, MD  sildenafil (VIAGRA) 100 MG tablet Take 1/2 to 1 tablet as needed.  Do not use if taking nitrates for heart disease. 03/31/21  Yes [provider]  Testosterone 75 MG PLLT Inject into the skin. 03/01/22  Yes [provider]  valsartan-hydrochlorothiazide (DIOVAN-HCT) 160-12.5 MG tablet Take 1 tablet by mouth daily. 10/03/22  Yes [provider]    Family History Family History  Problem Relation Age of Onset   Healthy Mother    Other Neg Hx     Social History Social History   Tobacco Use   Smoking status: Never   Smokeless tobacco: Never  Vaping Use   Vaping status: Never Used  Substance Use Topics   Alcohol use: No   Drug use: No     Allergies   Patient has no known allergies.   Review of Systems Review of Systems See HPI  Physical Exam Triage Vital Signs ED Triage Vitals  Encounter Vitals Group     BP 12/12/23 1801 (!) 167/95     Systolic BP Percentile --  Diastolic BP Percentile --      Pulse Rate 12/12/23 1801 93     Resp 12/12/23 1801 18     Temp 12/12/23 1801 98.5 F (36.9 C)     Temp Source 12/12/23 1801 Oral     SpO2 12/12/23 1801 95 %     Weight 12/12/23 1803 255 lb (115.7 kg)     Height 12/12/23 1803 5\' 9"  (1.753 m)     Head Circumference --      Peak Flow --      Pain Score 12/12/23 1803 10     Pain Loc --      Pain Education --      Exclude from Growth Chart --    No data found.  Updated Vital Signs BP (!) 167/95 (BP Location: Right Arm)   Pulse 93   Temp 98.5 F (36.9 C) (Oral)   Resp 18   Ht 5\' 9"  (1.753 m)   Wt 115.7 kg   SpO2 95%   BMI 37.66 kg/m      Physical Exam Constitutional:      General: He is not in acute distress.    Appearance: He is well-developed.  HENT:     Head:  Normocephalic and atraumatic.  Eyes:     Conjunctiva/sclera: Conjunctivae normal.     Pupils: Pupils are equal, round, and reactive to light.  Cardiovascular:     Rate and Rhythm: Normal rate.  Pulmonary:     Effort: Pulmonary effort is normal. No respiratory distress.  Abdominal:     General: There is no distension.     Palpations: Abdomen is soft.  Musculoskeletal:        General: Swelling and tenderness present. Normal range of motion.     Cervical back: Normal range of motion.     Comments: The right knee has obvious effusion.  Warmth.  Redness.  Tenderness around the joint line.  Pain with any movement of the joint.  Skin:    General: Skin is warm and dry.  Neurological:     Mental Status: He is alert.      UC Treatments / Results  Labs (all labs ordered are listed, but only abnormal results are displayed) Labs Reviewed - No data to display  EKG   Radiology DG Knee Complete 4 Views Right Result Date: 12/12/2023 CLINICAL DATA:  Right anterior knee pain, swelling. No known injury. EXAM: RIGHT KNEE - COMPLETE 4+ VIEW COMPARISON:  None Available. FINDINGS: No evidence of fracture, dislocation, or joint effusion. No evidence of arthropathy or other focal bone abnormality. Soft tissues are unremarkable. IMPRESSION: Negative. Electronically Signed   By: Charlett Nose M.D.   On: 12/12/2023 19:12    Procedures Procedures (including critical care time)  Medications Ordered in UC Medications  methylPREDNISolone sodium succinate (SOLU-MEDROL) 125 mg/2 mL injection 80 mg (80 mg Intramuscular Given 12/12/23 1913)    Initial Impression / Assessment and Plan / UC Course  I have reviewed the triage vital signs and the nursing notes.  Pertinent labs & imaging results that were available during my care of the patient were reviewed by me and considered in my medical decision making (see chart for details).     I explained to the patient that the x-rays were negative.  They were  read by radiology prior to discharge. Final Clinical Impressions(s) / UC Diagnoses   Final diagnoses:  Acute pain of right knee  Acute gout of right knee, unspecified cause  Discharge Instructions      You have received a shot of steroid to get this in your system quickly. Starting tomorrow take prednisone 50 mg once a day in the morning.  Take with food I have prescribed oxycodone to take for severe pain.  This should help you sleep at night.  Do not drive on oxycodone See your personal physician if not improving by next week      ED Prescriptions     Medication Sig Dispense Auth. Provider   predniSONE (DELTASONE) 50 MG tablet Take once a day for 5 days.  Take with food 5 tablet Eustace Moore, MD   oxyCODONE (ROXICODONE) 5 MG immediate release tablet Take 1-2 tablets (5-10 mg total) by mouth every 6 (six) hours as needed for severe pain (pain score 7-10). 10 tablet Eustace Moore, MD      I have reviewed the PDMP during this encounter.   Eustace Moore, MD 12/12/23 Izell Oretta    Eustace Moore, MD 12/12/23 [phone]

## 2023-12-12 NOTE — ED Triage Notes (Signed)
Patient c/o right knee pain x 3 weeks, denies any injury.  Patient was recently treated for kidney stones and was placed on Allopurinol due to the make up of the kidney stones.  Patient did start Prednisone yesterday from a former prescription.  Pain is worse at night.

## 2023-12-12 NOTE — Discharge Instructions (Addendum)
You have received a shot of steroid to get this in your system quickly. Starting tomorrow take prednisone 50 mg once a day in the morning.  Take with food I have prescribed oxycodone to take for severe pain.  This should help you sleep at night.  Do not drive on oxycodone See your personal physician if not improving by next week

## 2024-09-18 ENCOUNTER — Encounter (HOSPITAL_BASED_OUTPATIENT_CLINIC_OR_DEPARTMENT_OTHER): Payer: Self-pay | Admitting: Emergency Medicine

## 2024-09-18 ENCOUNTER — Emergency Department (HOSPITAL_BASED_OUTPATIENT_CLINIC_OR_DEPARTMENT_OTHER): Payer: Self-pay

## 2024-09-18 ENCOUNTER — Emergency Department (HOSPITAL_BASED_OUTPATIENT_CLINIC_OR_DEPARTMENT_OTHER)
Admission: EM | Admit: 2024-09-18 | Discharge: 2024-09-19 | Disposition: A | Discharge status: Other Acute Inpatient Hospital | Payer: Self-pay | Attending: Emergency Medicine | Admitting: Emergency Medicine

## 2024-09-18 ENCOUNTER — Other Ambulatory Visit: Payer: Self-pay

## 2024-09-18 DIAGNOSIS — N3000 Acute cystitis without hematuria: Secondary | ICD-10-CM

## 2024-09-18 DIAGNOSIS — R739 Hyperglycemia, unspecified: Secondary | ICD-10-CM | POA: Diagnosis not present

## 2024-09-18 DIAGNOSIS — R61 Generalized hyperhidrosis: Secondary | ICD-10-CM | POA: Diagnosis present

## 2024-09-18 DIAGNOSIS — I10 Essential (primary) hypertension: Secondary | ICD-10-CM | POA: Insufficient documentation

## 2024-09-18 DIAGNOSIS — N179 Acute kidney failure, unspecified: Secondary | ICD-10-CM | POA: Insufficient documentation

## 2024-09-18 DIAGNOSIS — Z79899 Other long term (current) drug therapy: Secondary | ICD-10-CM | POA: Insufficient documentation

## 2024-09-18 LAB — URINALYSIS, W/ REFLEX TO CULTURE (INFECTION SUSPECTED)
Bilirubin Urine: NEGATIVE
Glucose, UA: NEGATIVE mg/dL
Ketones, ur: NEGATIVE mg/dL
Nitrite: NEGATIVE
Protein, ur: 30 mg/dL — AB
Specific Gravity, Urine: 1.015 (ref 1.005–1.030)
WBC, UA: >50 WBC/hpf (ref 0–5)
pH: 5.5 (ref 5.0–8.0)

## 2024-09-18 LAB — COMPREHENSIVE METABOLIC PANEL WITH GFR
ALT: 74 U/L — ABNORMAL HIGH (ref 0–44)
AST: 19 U/L (ref 15–41)
Albumin: 3.4 g/dL — ABNORMAL LOW (ref 3.5–5.0)
Alkaline Phosphatase: 101 U/L (ref 38–126)
Anion gap: 15 (ref 5–15)
BUN: 69 mg/dL — ABNORMAL HIGH (ref 8–23)
CO2: 21 mmol/L — ABNORMAL LOW (ref 22–32)
Calcium: 9.6 mg/dL (ref 8.9–10.3)
Chloride: 95 mmol/L — ABNORMAL LOW (ref 98–111)
Creatinine, Ser: 2.99 mg/dL — ABNORMAL HIGH (ref 0.61–1.24)
GFR, Estimated: 23 mL/min — ABNORMAL LOW (ref >60)
Glucose, Bld: 366 mg/dL — ABNORMAL HIGH (ref 70–99)
Potassium: 4.8 mmol/L (ref 3.5–5.1)
Sodium: 130 mmol/L — ABNORMAL LOW (ref 135–145)
Total Bilirubin: 0.7 mg/dL (ref 0.0–1.2)
Total Protein: 8 g/dL (ref 6.5–8.1)

## 2024-09-18 LAB — CBC WITH DIFFERENTIAL/PLATELET
Basophils Absolute: 0 K/uL (ref 0.0–0.1)
Basophils Relative: 0 %
Eosinophils Absolute: 0.5 K/uL (ref 0.0–0.5)
Eosinophils Relative: 3 %
HCT: 35.4 % — ABNORMAL LOW (ref 39.0–52.0)
Hemoglobin: 12.1 g/dL — ABNORMAL LOW (ref 13.0–17.0)
Lymphocytes Relative: 14 %
Lymphs Abs: 2.4 K/uL (ref 0.7–4.0)
MCH: 31.3 pg (ref 26.0–34.0)
MCHC: 34.2 g/dL (ref 30.0–36.0)
MCV: 91.5 fL (ref 80.0–100.0)
Monocytes Absolute: 1.9 K/uL — ABNORMAL HIGH (ref 0.1–1.0)
Monocytes Relative: 11 %
Neutro Abs: 12.2 K/uL — ABNORMAL HIGH (ref 1.7–7.7)
Neutrophils Relative %: 72 %
Platelets: 404 K/uL — ABNORMAL HIGH (ref 150–400)
RBC: 3.87 MIL/uL — ABNORMAL LOW (ref 4.22–5.81)
RDW: 13.8 % (ref 11.5–15.5)
Smear Review: NORMAL
WBC: 16.9 K/uL — ABNORMAL HIGH (ref 4.0–10.5)
nRBC: 0 % (ref 0.0–0.2)

## 2024-09-18 LAB — LACTIC ACID, PLASMA: Lactic Acid, Venous: 1.3 mmol/L (ref 0.5–1.9)

## 2024-09-18 MED ORDER — SODIUM CHLORIDE 0.9 % IV SOLN
1.0000 g | Freq: Once | INTRAVENOUS | Status: AC
  Administered 2024-09-18: 1 g via INTRAVENOUS
  Filled 2024-09-18: qty 10

## 2024-09-18 MED ORDER — SODIUM CHLORIDE 0.9 % IV SOLN: INTRAVENOUS | Status: DC

## 2024-09-18 MED ORDER — SODIUM CHLORIDE 0.9 % IV BOLUS
1000.0000 mL | Freq: Once | INTRAVENOUS | Status: AC
  Administered 2024-09-18: 1000 mL via INTRAVENOUS

## 2024-09-18 MED ORDER — SODIUM CHLORIDE 0.9 % IV SOLN
INTRAVENOUS | Status: DC | PRN
Start: 2024-09-18 — End: 2024-09-19
  Administered 2024-09-18: 100 mL via INTRAVENOUS

## 2024-09-18 NOTE — ED Triage Notes (Signed)
 Pt reports BL kidney stent placement 09/07/2024.    Had f/u Monday, removed stents, was told he had bladder infection, prescribed cipro , no improvement.   C/o generalized weakness since procedure 9/22.  Poor po intake since procedure.

## 2024-09-18 NOTE — ED Provider Notes (Addendum)
 [place] [facility] Provider Note   CSN: [id number] Arrival date & time: 09/18/24  1906     Patient presents with: Weakness   Daniel Collier is a 63 y.o. male.   Patient had stents removed 1 week ago.  By [facility] in [facility].  Patient had bilateral stents for stone obstruction.  Last labs we have from them are on September 16 GFR was 63 creatinine 1.29 white count 5.82 with a hemoglobin of 15.3.  Patient stated that he had a urinary tract infection when they removed the stents he has been on Cipro  500 mg twice a day.  Has had decreased appetite is taking fluids not feeling well has had some diaphoresis at night some night sweats along with the feeling of fever.  Has some bilateral lower quadrant abdominal discomfort.  Temp here 97.2 pulse 99 respirations 18 blood pressure 101/67 oxygen saturation is 93% on room air past medical history significant for hypertension and history of kidney stones.  No history of diabetes not on diabetic medicine.  Patient without sepsis parameters based on vital signs.       Prior to Admission medications   Medication Sig Start Date End Date Taking? Authorizing Provider  allopurinol (ZYLOPRIM) 300 MG tablet Take 300 mg by mouth daily. 05/16/22   [provider]  atorvastatin (LIPITOR) 80 MG tablet Take 80 mg by mouth daily. 03/05/22   [provider]  EPINEPHrine 0.3 mg/0.3 mL IJ SOAJ injection Inject 0.3 mg into the muscle as needed for anaphylaxis. 06/09/20   [provider]  fluticasone (FLONASE) 50 MCG/ACT nasal spray Place 2 sprays into both nostrils daily. 02/24/16   [provider]  oxyCODONE  (ROXICODONE ) 5 MG immediate release tablet Take 1-2 tablets (5-10 mg total) by mouth every 6 (six) hours as needed for severe pain (pain score 7-10). 12/12/23   Maranda Jamee Jacob, MD  predniSONE  (DELTASONE ) 50 MG tablet Take once a day for 5 days.  Take with food 12/12/23   Maranda Jamee Jacob, MD  sildenafil (VIAGRA) 100 MG tablet Take 1/2 to 1 tablet as needed.  Do not use if taking nitrates for heart disease. 03/31/21   [provider]  Testosterone 75 MG PLLT Inject into the skin. 03/01/22   [provider]  valsartan-hydrochlorothiazide (DIOVAN-HCT) 160-12.5 MG tablet Take 1 tablet by mouth daily. 10/03/22   [provider]    Allergies: Patient has no known allergies.    Review of Systems  Constitutional:  Positive for appetite change, diaphoresis and fever. Negative for chills.  HENT:  Negative for ear pain and sore throat.   Eyes:  Negative for pain and visual disturbance.  Respiratory:  Negative for cough and shortness of breath.   Cardiovascular:  Negative for chest pain and palpitations.  Gastrointestinal:  Positive for abdominal pain. Negative for nausea and vomiting.  Genitourinary:  Negative for dysuria and hematuria.  Musculoskeletal:  Negative for arthralgias and back pain.  Skin:  Negative for color change and rash.  Neurological:  Negative for seizures and syncope.  All other systems reviewed and are negative.   Updated Vital Signs BP 101/67 (BP Location: Left Arm)   Pulse 99   Temp (!) 97.2 F (36.2 C)   Resp 18   Ht 1.753 m (5' 9)   Wt 115.7 kg   SpO2 93%   BMI 37.66 kg/m   Physical Exam Vitals and nursing note reviewed.  Constitutional:  General: He is not in acute distress.    Appearance: Normal appearance. He is well-developed.  HENT:     Head: Normocephalic and atraumatic.     Mouth/Throat:     Mouth: Mucous membranes are moist.  Eyes:     Extraocular Movements: Extraocular movements intact.     Conjunctiva/sclera: Conjunctivae normal.     Pupils: Pupils are equal, round, and reactive to light.  Cardiovascular:     Rate and Rhythm: Normal rate and regular rhythm.     Heart sounds: No murmur heard. Pulmonary:     Effort: Pulmonary effort is normal. No respiratory distress.     Breath sounds: Normal  breath sounds.  Abdominal:     Palpations: Abdomen is soft.     Tenderness: There is no abdominal tenderness. There is no guarding.  Musculoskeletal:        General: No swelling.     Cervical back: Normal range of motion and neck supple. No rigidity.  Skin:    General: Skin is warm and dry.     Capillary Refill: Capillary refill takes less than 2 seconds.  Neurological:     General: No focal deficit present.     Mental Status: He is alert and oriented to person, place, and time.  Psychiatric:        Mood and Affect: Mood normal.     (all labs ordered are listed, but only abnormal results are displayed) Labs Reviewed  COMPREHENSIVE METABOLIC PANEL WITH GFR - Abnormal; Notable for the following components:      Result Value   Sodium 130 (*)    Chloride 95 (*)    CO2 21 (*)    Glucose, Bld 366 (*)    BUN 69 (*)    Creatinine, Ser 2.99 (*)    Albumin 3.4 (*)    ALT 74 (*)    GFR, Estimated 23 (*)    All other components within normal limits  CBC WITH DIFFERENTIAL/PLATELET - Abnormal; Notable for the following components:   WBC 16.9 (*)    RBC 3.87 (*)    Hemoglobin 12.1 (*)    HCT 35.4 (*)    Platelets 404 (*)    Neutro Abs 12.2 (*)    Monocytes Absolute 1.9 (*)    All other components within normal limits  CULTURE, BLOOD (ROUTINE X 2)  CULTURE, BLOOD (ROUTINE X 2)  LACTIC ACID, PLASMA  LACTIC ACID, PLASMA  URINALYSIS, W/ REFLEX TO CULTURE (INFECTION SUSPECTED)    EKG: EKG Interpretation Date/Time:  Friday September 18 2024 19:20:23 EDT Ventricular Rate:  101 PR Interval:  122 QRS Duration:  92 QT Interval:  354 QTC Calculation: 459 R Axis:   13  Text Interpretation: Sinus tachycardia Borderline repolarization abnormality Borderline ST elevation, anterior leads Confirmed by Reiley Bertagnolli [phone]) on 09/18/2024 8:51:57 PM  Radiology: [facility] Chest 2 View Result Date: 09/18/2024 EXAM: 2 VIEW(S) XRAY OF THE CHEST 09/18/2024 07:38:00 PM COMPARISON: None available.  CLINICAL HISTORY: Generalized weakness since bilateral renal stent placement procedure on 9/22. FINDINGS: LUNGS AND PLEURA: No focal pulmonary opacity. No pulmonary edema. No pleural effusion. No pneumothorax. HEART AND MEDIASTINUM: No acute abnormality of the cardiac and mediastinal silhouettes. BONES AND SOFT TISSUES: No acute osseous abnormality. IMPRESSION: 1. No acute process. Electronically signed by: Suzen Dials MD 09/18/2024 07:52 PM EDT RP Workstation: [id number]     Procedures   Medications Ordered in the ED  sodium chloride  0.9 % bolus 1,000 mL (has no administration in time  range)  0.9 %  sodium chloride  infusion (has no administration in time range)                                    Medical Decision Making Amount and/or Complexity of Data Reviewed Labs: ordered. Radiology: ordered.  Risk Prescription drug management. Decision regarding hospitalization.   Patient status post stent removal by [facility] in [facility].  Complete metabolic panel significant for sodium of 130 potassium normal chloride down a little bit 95 glucose 366.  GFR 24 with a creatinine of 2.99.  Will have to compare that to previous.  Total bili normal.  CBC white count is 16.9 hemoglobin 12.1 platelets 404.  Lactic acid at 1.3 is reassuring.  Chest x-ray without any acute process.  Patient's GFR on September 16 was 63 with a creatinine 1.29.  And white count was 5.82.  Hemoglobin 15.3.  Based on this there is a concerns for acute kidney injury.  Could be significant infection.  Will give IV fluids.  Not meeting sepsis criteria based on lactic acid being normal.  White blood cell count is 16.9.  Also blood sugar is markedly elevated.  Does not really have a history of diabetes so that is concerning.  Urinalysis seems to consistent with urinary tract infection.  Culture sent.  Will start Rocephin for that.  As noted above patient has acute kidney injury has some hyperglycemia.  White count  elevated 16.9 but does not have sepsis parameters at this time.  CT renal showed no evidence of any obstruction.  Did show some bladder wall thickening.  Which may be go along with the infection.  Discussed with [facility] admitting folks they were paged out to [facility] for hospitalist admission.  Patient's vital signs very stable.  Will give Rocephin and has receiving IV fluids for the acute kidney injury and blood cultures are pending and urine culture pending.  CRITICAL CARE Performed by: Jon Kasparek Total critical care time: 45 minutes Critical care time was exclusive of separately billable procedures and treating other patients. Critical care was necessary to treat or prevent imminent or life-threatening deterioration. Critical care was time spent personally by me on the following activities: development of treatment plan with patient and/or surrogate as well as nursing, discussions with consultants, evaluation of patient's response to treatment, examination of patient, obtaining history from patient or surrogate, ordering and performing treatments and interventions, ordering and review of laboratory studies, ordering and review of radiographic studies, pulse oximetry and re-evaluation of patient's condition.   Final diagnoses:  Acute kidney injury  Hyperglycemia    ED Discharge Orders     None          Geraldene Hamilton, MD 09/18/24 2112    Geraldene Hamilton, MD 09/18/24 [phone]

## 2024-09-19 NOTE — ED Notes (Signed)
 Transfer consent obtained.  Pt in the care of air care ground unit at this time.

## 2024-09-20 LAB — URINE CULTURE: Culture: <10000 — AB

## 2024-09-23 LAB — CULTURE, BLOOD (ROUTINE X 2)
Culture: NO GROWTH
Special Requests: ADEQUATE
